# Patient Record
Sex: Female | Born: 1965 | Race: Black or African American | Hispanic: No | Smoking: Never smoker
Health system: Southern US, Academic
[De-identification: ages and names within clinical notes are randomized; demographics above are authoritative.]

## PROBLEM LIST (undated history)

## (undated) DIAGNOSIS — I10 Essential (primary) hypertension: Secondary | ICD-10-CM

## (undated) DIAGNOSIS — G43909 Migraine, unspecified, not intractable, without status migrainosus: Secondary | ICD-10-CM

## (undated) DIAGNOSIS — K219 Gastro-esophageal reflux disease without esophagitis: Secondary | ICD-10-CM

## (undated) DIAGNOSIS — F419 Anxiety disorder, unspecified: Secondary | ICD-10-CM

## (undated) DIAGNOSIS — F32A Depression, unspecified: Secondary | ICD-10-CM

## (undated) DIAGNOSIS — E119 Type 2 diabetes mellitus without complications: Secondary | ICD-10-CM

## (undated) HISTORY — PX: HX APPENDECTOMY: SHX54

## (undated) HISTORY — PX: HX CARPAL TUNNEL RELEASE: SHX101

## (undated) HISTORY — PX: HX HYSTERECTOMY: SHX81

## (undated) HISTORY — PX: HX TUBAL LIGATION: SHX77

## (undated) HISTORY — PX: HX TRIGGER FINGER RELEASE: 210000200

---

## 2003-07-13 ENCOUNTER — Emergency Department (HOSPITAL_COMMUNITY): Payer: Self-pay | Admitting: Emergency Medicine

## 2012-05-22 ENCOUNTER — Other Ambulatory Visit (HOSPITAL_COMMUNITY): Payer: Self-pay

## 2012-05-30 ENCOUNTER — Ambulatory Visit
Admission: RE | Admit: 2012-05-30 | Discharge: 2012-05-30 | Disposition: A | Discharge status: Home / Self Care | Payer: Self-pay | Attending: Diagnostic Radiology | Admitting: Diagnostic Radiology

## 2012-05-30 DIAGNOSIS — Z1231 Encounter for screening mammogram for malignant neoplasm of breast: Secondary | ICD-10-CM | POA: Insufficient documentation

## 2012-05-30 DIAGNOSIS — Z1239 Encounter for other screening for malignant neoplasm of breast: Secondary | ICD-10-CM

## 2012-06-03 ENCOUNTER — Other Ambulatory Visit (HOSPITAL_COMMUNITY): Payer: Self-pay | Admitting: Family

## 2012-06-03 DIAGNOSIS — Z1239 Encounter for other screening for malignant neoplasm of breast: Secondary | ICD-10-CM

## 2013-07-30 ENCOUNTER — Encounter (HOSPITAL_BASED_OUTPATIENT_CLINIC_OR_DEPARTMENT_OTHER): Payer: Self-pay

## 2019-04-17 IMAGING — MR MRI THORACIC SPINE WITHOUT CONTRAST
4 of 5 series · 26 of 48 positions shown · IV contrast (gadolinium)
Comparison: Radiographs dated 04/03/2019.

EXAM:  MRI THORACIC SPINE WITHOUT CONTRAST
INDICATION: Chronic mid back pain.
TECHNIQUE: Multiplanar multisequential MRI of the thoracic spine was performed without gadolinium contrast.

[Series 7: T2 · sagittal · 4.0mm · 0.78mm/px · 8 of 13 slices shown (1 of 2)]
[im 1/13]
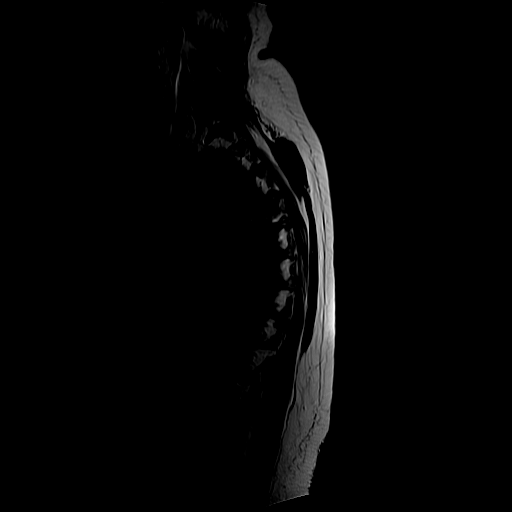
[im 2/13]
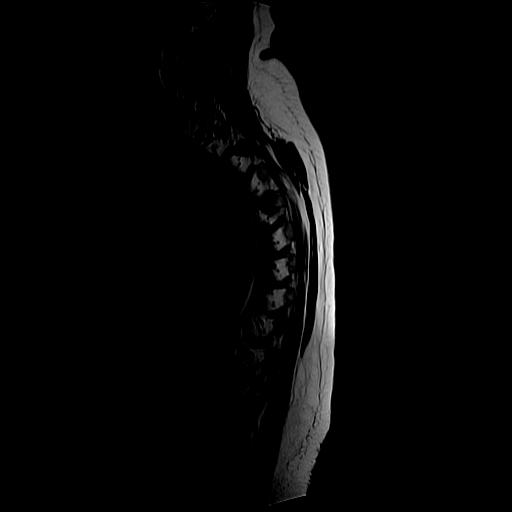
[im 5/13]
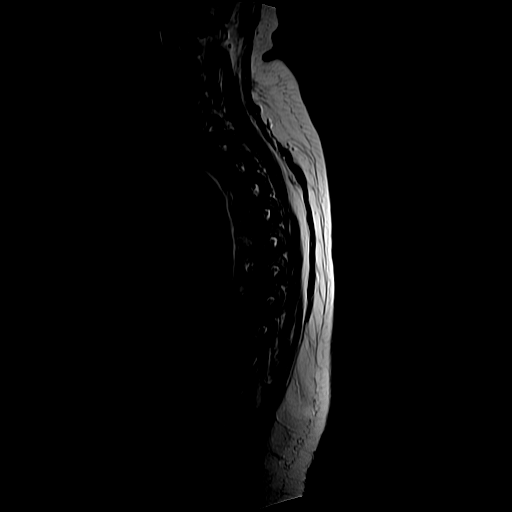
[im 6/13]
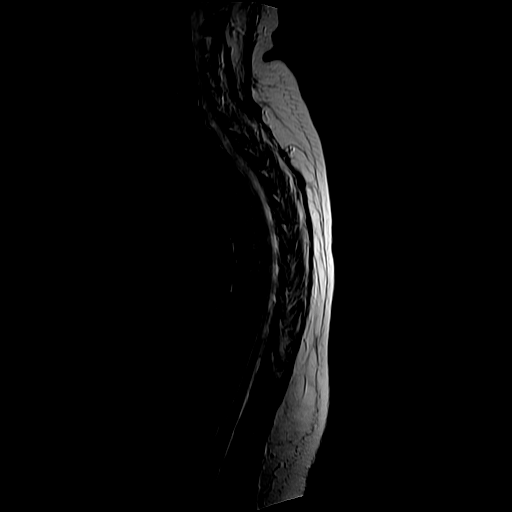
[im 7/13]
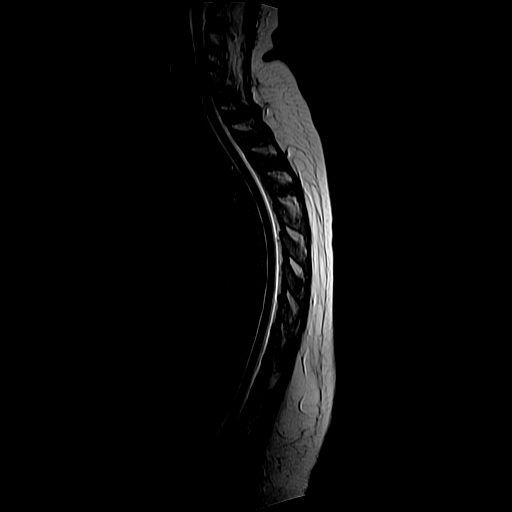
[im 9/13]
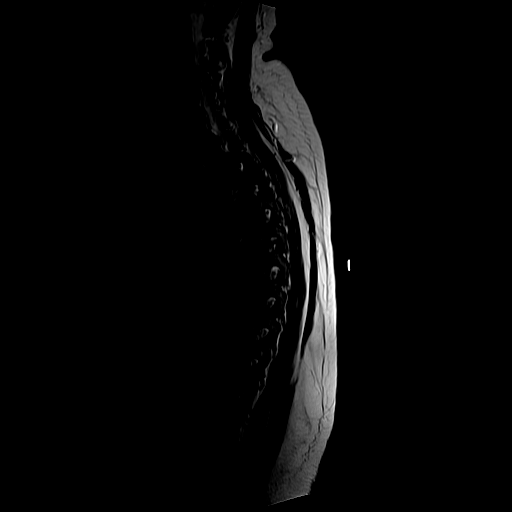
[im 11/13]
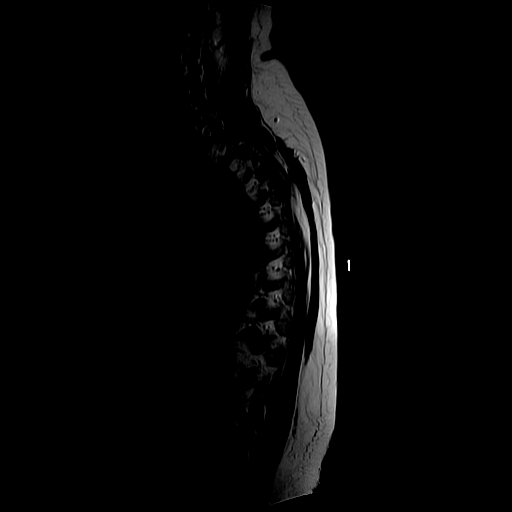
[im 13/13]
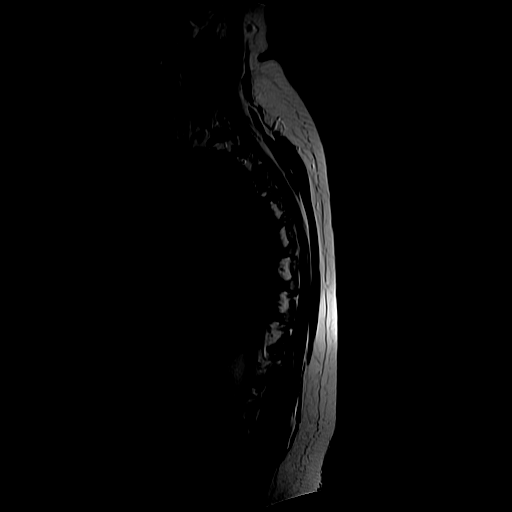

[Series 8: T1 · sagittal · 4.0mm · 0.78mm/px · 6 of 13 slices shown (1 of 2)]
[im 1/13]
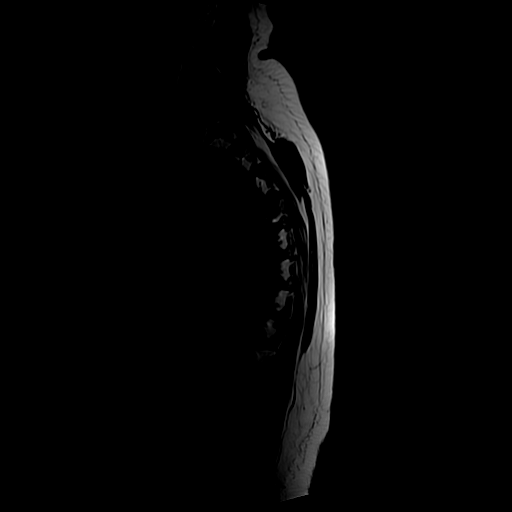
[im 2/13]
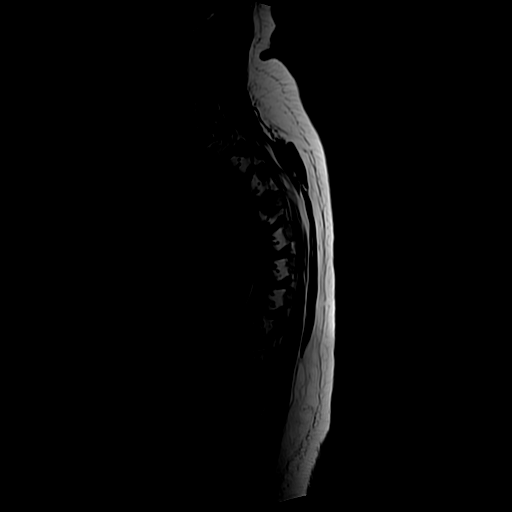
[im 5/13]
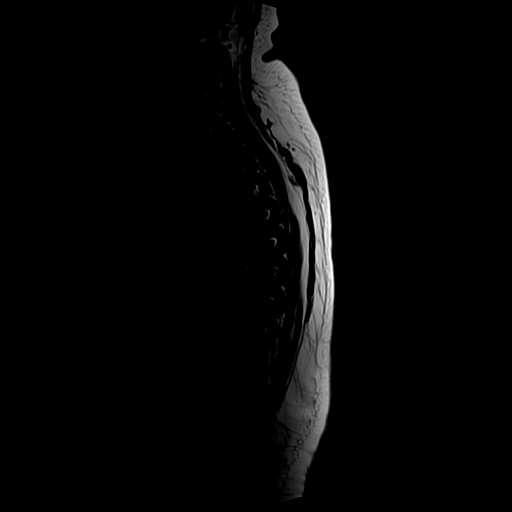
[im 6/13]
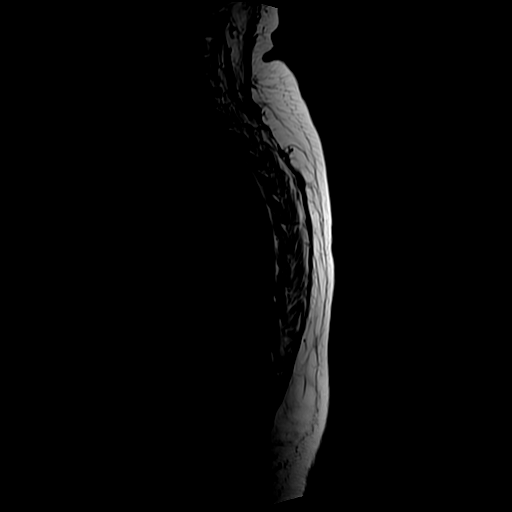
[im 7/13]
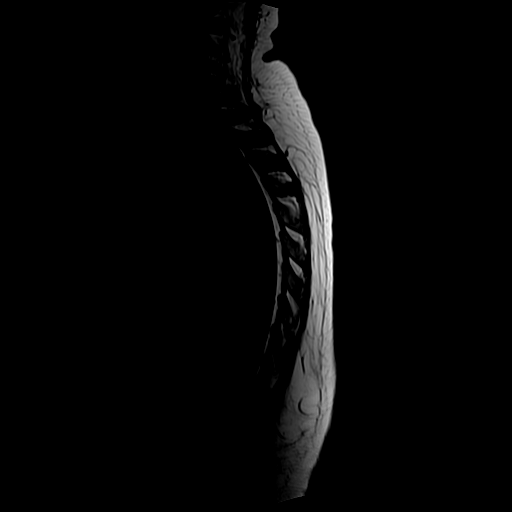
[im 11/13]
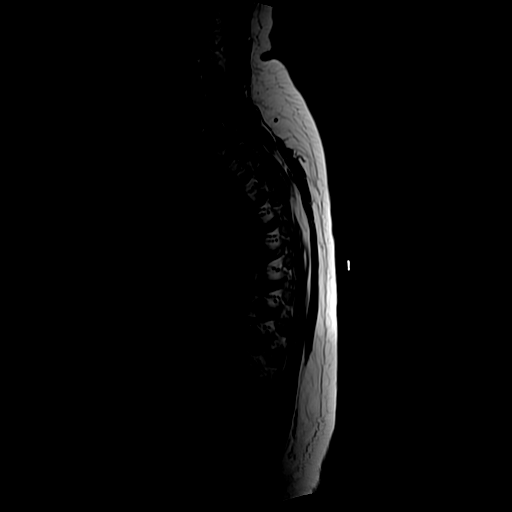

[Series 10: T2 · axial · 4.0mm · 0.62mm/px · z∈[-229,-71]mm · 9 of 12 slices shown (2 of 2)]
[im 1/12]
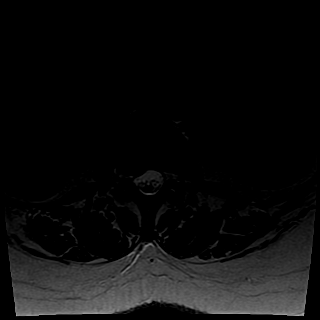
[im 2/12]
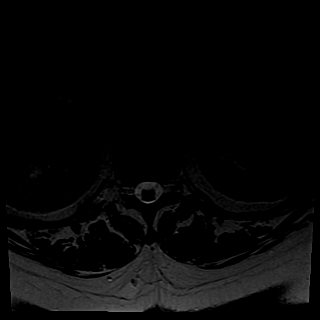
[im 3/12]
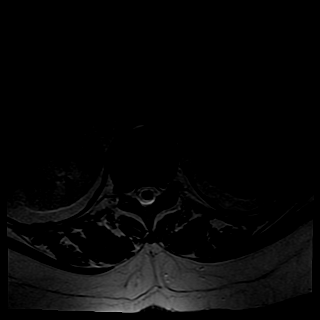
[im 5/12]
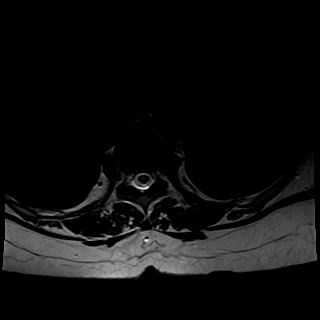
[im 6/12]
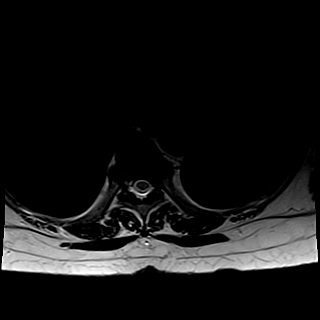
[im 7/12]
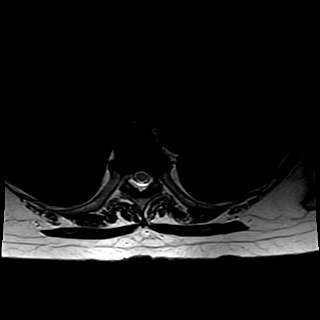
[im 9/12]
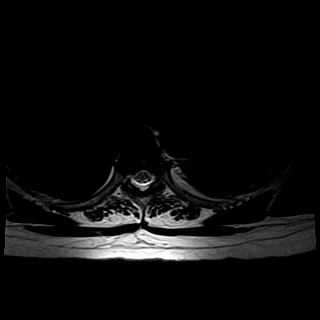
[im 10/12]
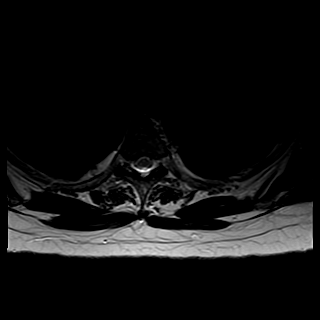
[im 12/12]
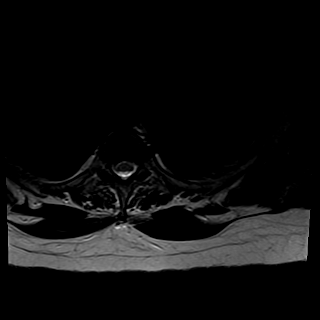

[Series 11: T1 · axial · 4.0mm · 0.62mm/px · z∈[-197,-78]mm · 3 of 12 slices shown (2 of 2)]
[im 2/12]
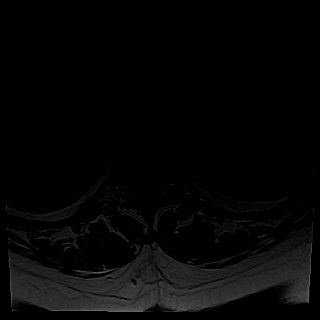
[im 6/12]
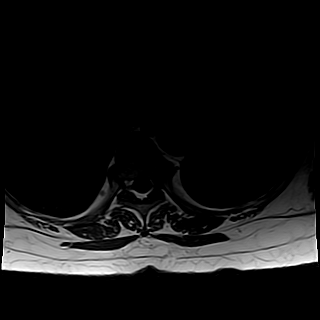
[im 10/12]
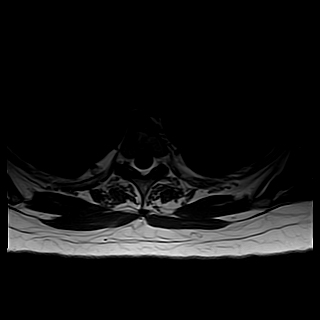

[26 of 48 positions shown; findings below may reference images not displayed]

FINDINGS: There is slight exaggeration of thoracic kyphosis. Vertebral bodies are normal in height and signal intensity. There is no acute fracture or subluxation. Visualized spinal cord is also normal in signal intensity without evidence of compression at any level.

There is no significant disc herniation, spinal canal or neural foraminal stenosis at any level. Paraspinal soft tissues are also unremarkable. There is no pleural effusion.
IMPRESSION: Unremarkable exam.

## 2020-02-20 IMAGING — MR MRI LUMBAR SPINE WITHOUT CONTRAST
9 series · 48 of 48 positions shown · IV contrast (gadolinium)
Comparison: Radiographs dated 04/03/2019.

EXAM:  MRI LUMBAR SPINE WITHOUT CONTRAST
INDICATION: Lower back pain with left lower extremity radiculopathy.
TECHNIQUE: Multiplanar multisequential MRI of the lumbar spine was performed without gadolinium contrast.

[Series 7: sca (id) · sagittal · 10.0mm · 1.76mm/px · 1 of 5 slices shown (1 of 3)]
[im 1/5]
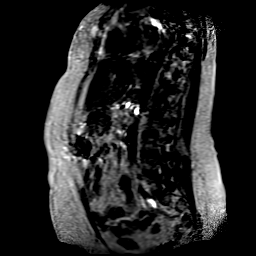

[Series 8: sca (id) · coronal · 10.0mm · 1.76mm/px · 2 of 5 slices shown (2 of 3)]
[im 1/5]
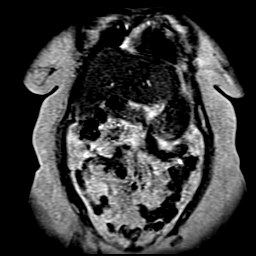
[im 5/5]
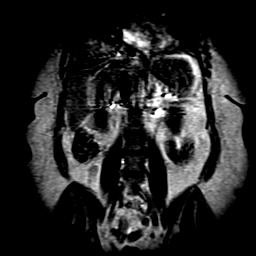

[Series 9: sca (id) · axial · 10.0mm · 1.76mm/px · z∈[-30,+30]mm · 2 of 5 slices shown (3 of 3)]
[im 1/5]
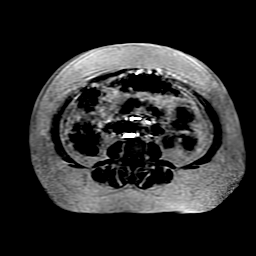
[im 5/5]
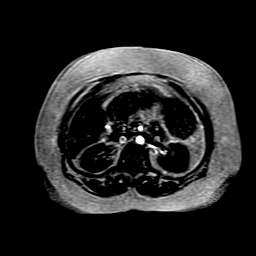

[Series 12: T2 · coronal · 4.0mm · 1.34mm/px · 8 of 20 slices shown (1 of 3)]
[im 1/20]
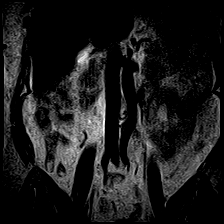
[im 3/20]
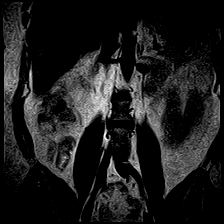
[im 6/20]
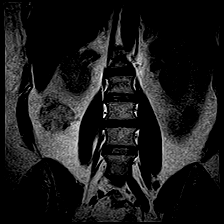
[im 9/20]
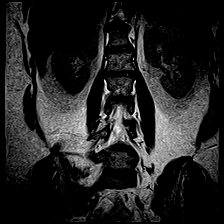
[im 11/20]
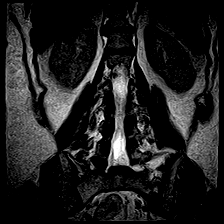
[im 14/20]
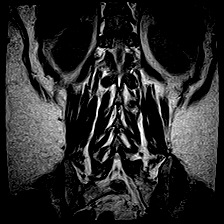
[im 17/20]
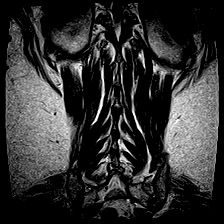
[im 20/20]
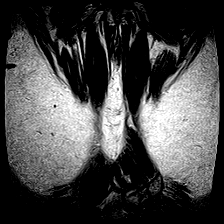

[Series 13: T2 · sagittal · 5.0mm · 1.00mm/px · 5 of 13 slices shown (2 of 3)]
[im 1/13]
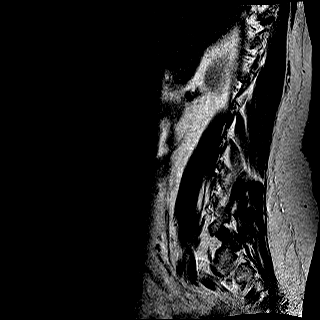
[im 4/13]
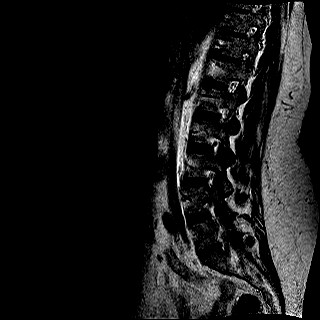
[im 7/13]
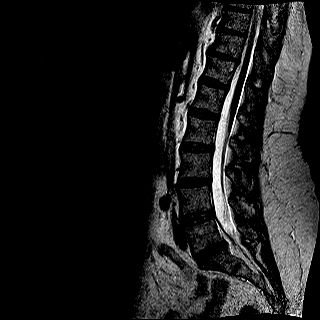
[im 10/13]
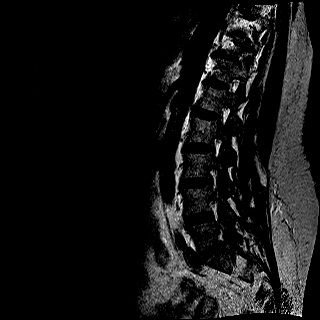
[im 13/13]
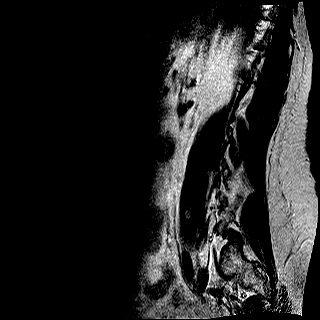

[Series 14: T1 · sagittal · 5.0mm · 1.00mm/px · 5 of 13 slices shown (1 of 2)]
[im 1/13]
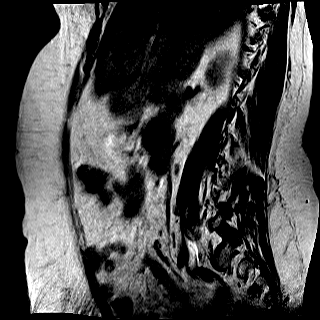
[im 4/13]
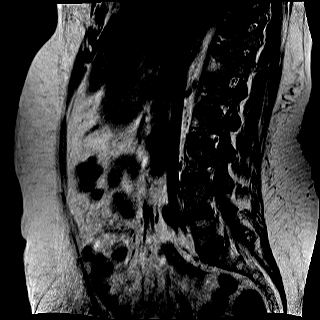
[im 7/13]
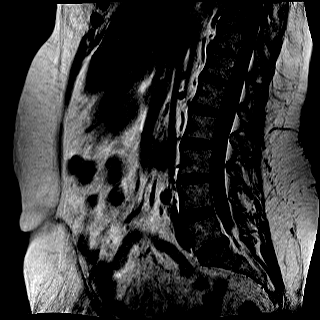
[im 10/13]
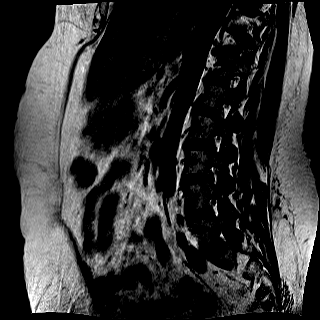
[im 13/13]
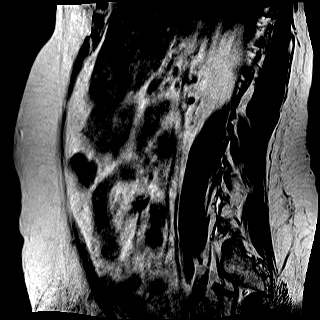

[Series 15: STIR · sagittal · 5.0mm · 1.25mm/px · 5 of 13 slices shown]
[im 1/13]
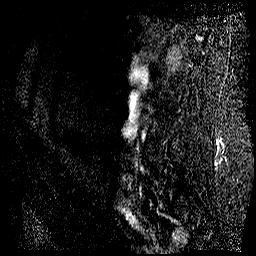
[im 4/13]
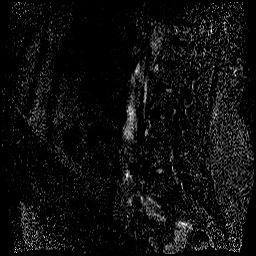
[im 7/13]
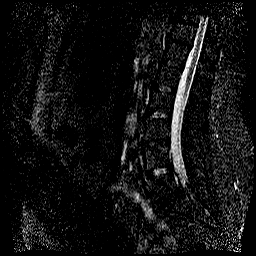
[im 10/13]
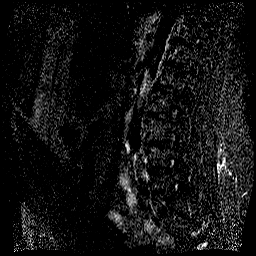
[im 13/13]
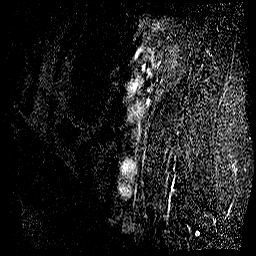

[Series 16: T2 · axial · 5.0mm · 0.89mm/px · z∈[-158,+51]mm · 10 of 25 slices shown (3 of 3)]
[im 1/25]
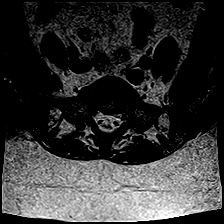
[im 3/25]
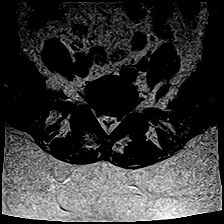
[im 6/25]
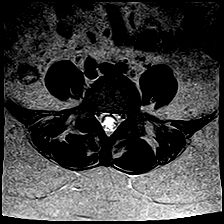
[im 9/25]
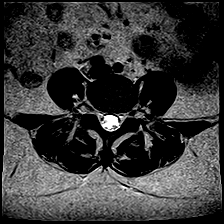
[im 11/25]
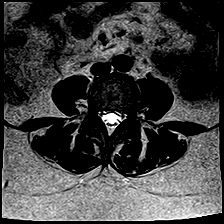
[im 14/25]
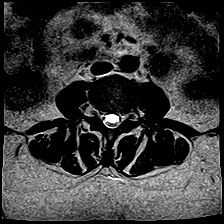
[im 17/25]
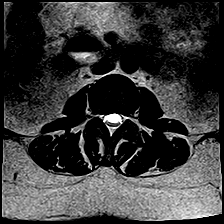
[im 19/25]
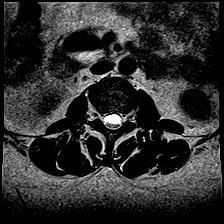
[im 22/25]
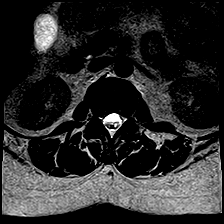
[im 25/25]
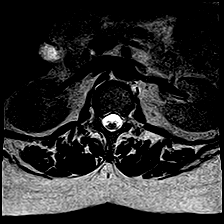

[Series 19: T1 · axial · 5.0mm · 0.89mm/px · z∈[-158,+51]mm · 10 of 25 slices shown (2 of 2)]
[im 1/25]
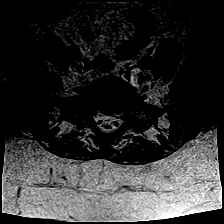
[im 3/25]
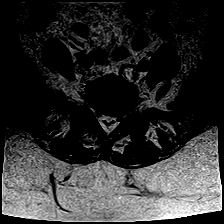
[im 6/25]
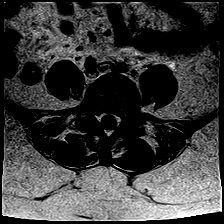
[im 9/25]
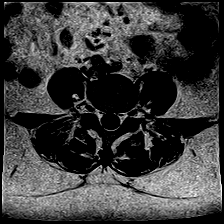
[im 11/25]
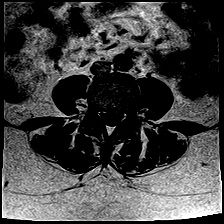
[im 14/25]
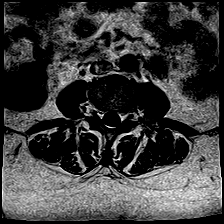
[im 17/25]
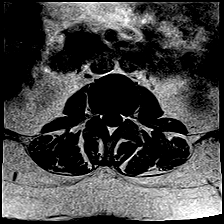
[im 19/25]
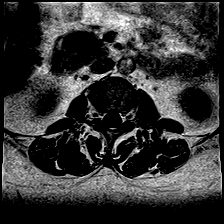
[im 22/25]
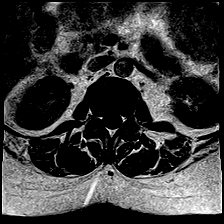
[im 25/25]
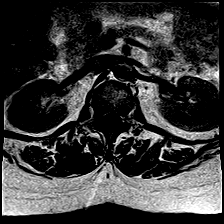

[48 of 48 positions shown; findings below may reference images not displayed]

FINDINGS: Vertebral bodies are normal in height, alignment and signal intensity. There is no acute fracture or subluxation. Distal spinal cord is normal in signal intensity and terminates normally at L1-2 disc space level. Spinal canal is congenitally narrow. There is a clumping of nerve roots within the dependent thecal sac.

L1-2 and L2-3 levels are unremarkable.

At L3-4 level, there is mild-to-moderate bilateral neural foraminal stenosis from facet arthropathy and bulging annulus.

At L4-5 level, there is mild bilateral neural foraminal stenosis from facet arthropathy without nerve root impingement.

At L5-S1 level, there is a small broad-based central disc bulge without mass effect on the thecal sac. There is severe spinal stenosis from epidural lipomatosis. There is also moderate bilateral neural foraminal stenosis from facet arthropathy.

Paraspinal soft tissues are unremarkable.
IMPRESSION: Clumping of nerve roots within the dependent thecal sac, suggestive of arachnoiditis. 

No significant disc herniation at any level. 

Severe spinal stenosis at L5-S1 level from epidural lipomatosis. 

Multilevel neural foraminal stenosis as detailed above.

## 2020-03-16 IMAGING — MR MRI LUMBAR SPINE WITHOUT AND WITH CONTRAST
4 series · 48 of 48 positions shown · IV contrast (Gadavist)
Comparison: Noncontrast MRI dated 02/20/2020.

﻿EXAM:  MRI LUMBAR SPINE WITHOUT AND WITH CONTRAST
INDICATION: Arachnoiditis, back pain.
TECHNIQUE: Axial and sagittal T1 fat saturated sequences were obtained without and with 4 mL of Gadavist.

[Series 11: T1 fat-sat · sagittal · 4.0mm · 1.05mm/px · 9 of 13 slices shown (1 of 2)]
[im 1/13]
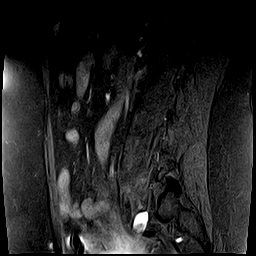
[im 2/13]
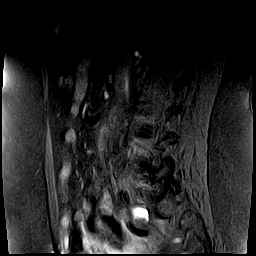
[im 4/13]
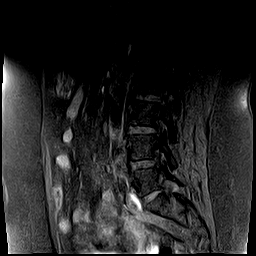
[im 5/13]
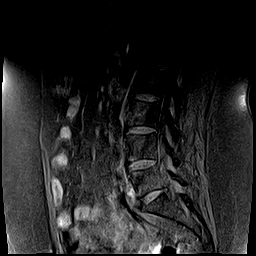
[im 7/13]
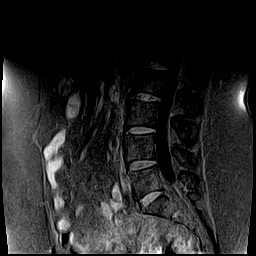
[im 8/13]
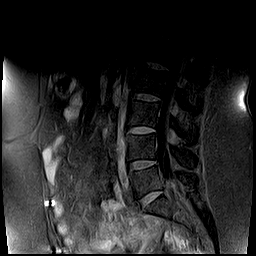
[im 10/13]
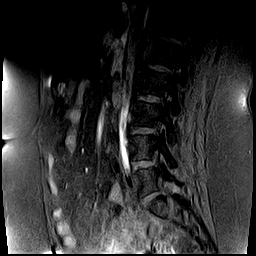
[im 11/13]
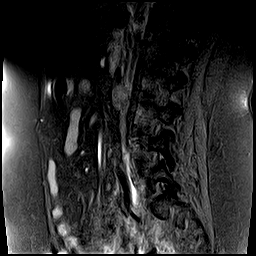
[im 13/13]
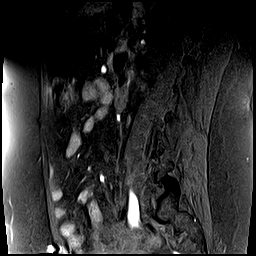

[Series 12: T1 fat-sat · axial · 4.0mm · 0.70mm/px · z∈[-105,+94]mm · 15 of 23 slices shown (2 of 2)]
[im 1/23]
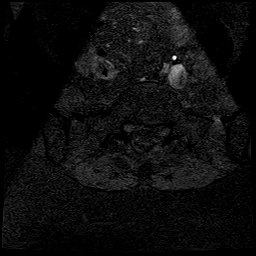
[im 2/23]
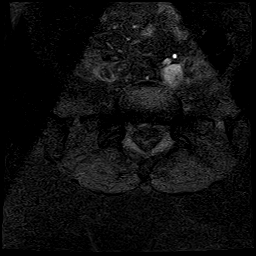
[im 4/23]
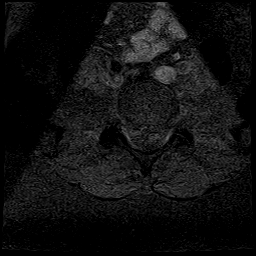
[im 5/23]
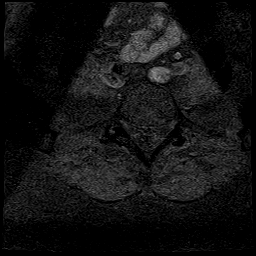
[im 7/23]
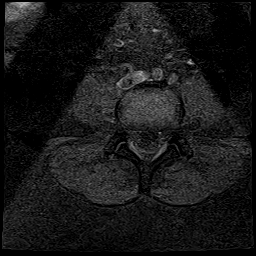
[im 8/23]
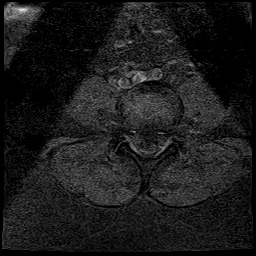
[im 10/23]
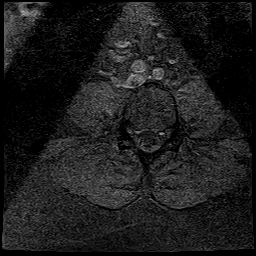
[im 12/23]
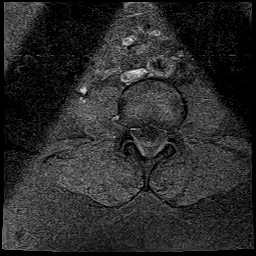
[im 13/23]
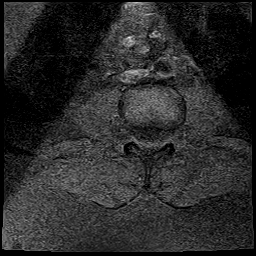
[im 15/23]
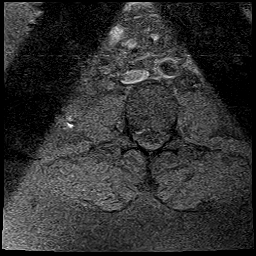
[im 16/23]
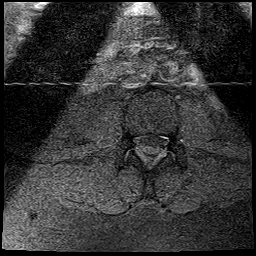
[im 18/23]
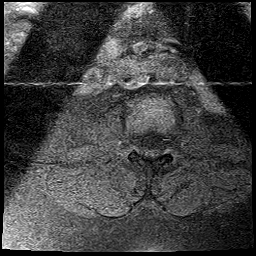
[im 19/23]
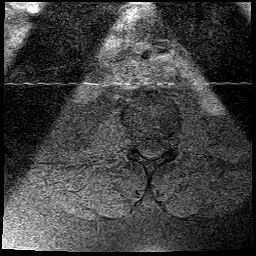
[im 21/23]
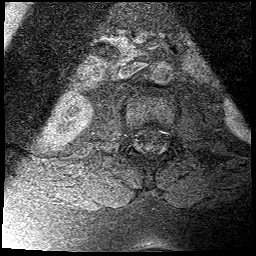
[im 23/23]
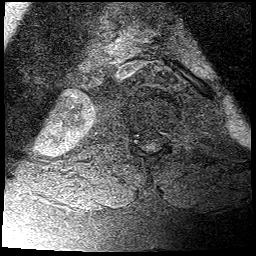

[Series 13: T1 fat-sat post-contrast · sagittal · 4.0mm · 1.05mm/px · 9 of 13 slices shown (1 of 2)]
[im 1/13]
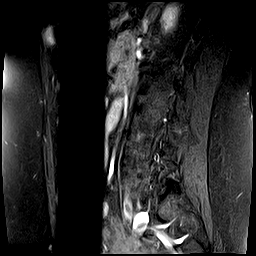
[im 2/13]
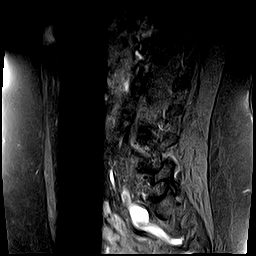
[im 4/13]
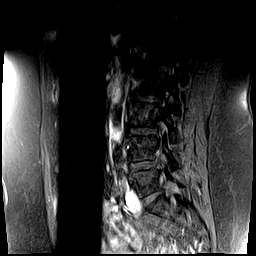
[im 5/13]
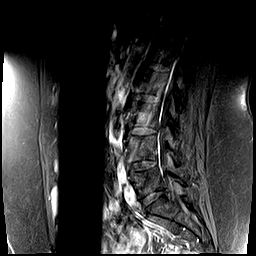
[im 7/13]
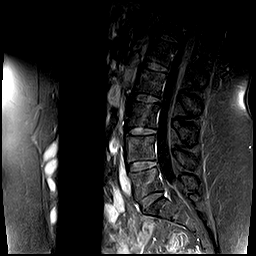
[im 8/13]
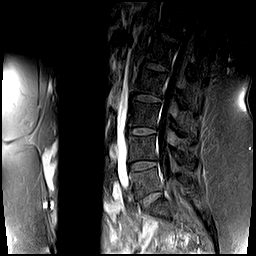
[im 10/13]
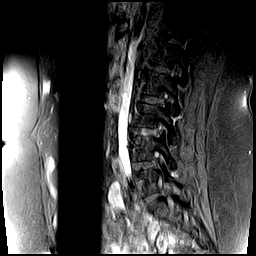
[im 11/13]
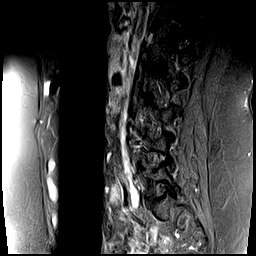
[im 13/13]
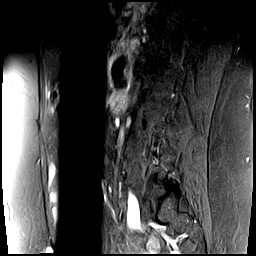

[Series 14: T1 fat-sat post-contrast · axial · 4.0mm · 0.70mm/px · z∈[-105,+94]mm · 15 of 23 slices shown (2 of 2)]
[im 1/23]
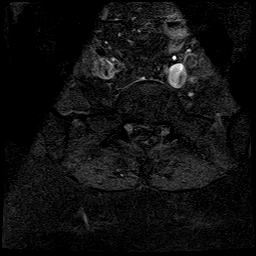
[im 2/23]
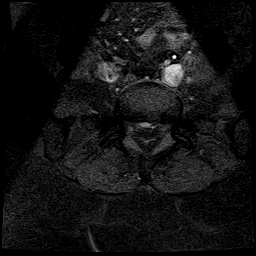
[im 4/23]
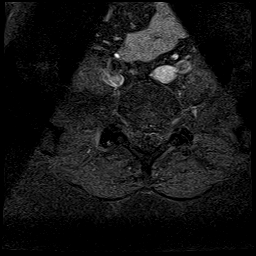
[im 5/23]
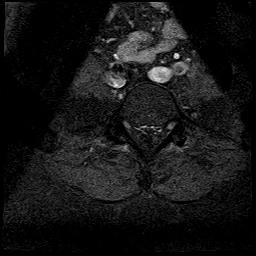
[im 7/23]
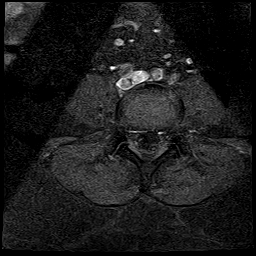
[im 8/23]
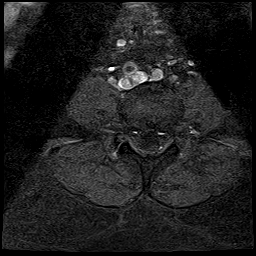
[im 10/23]
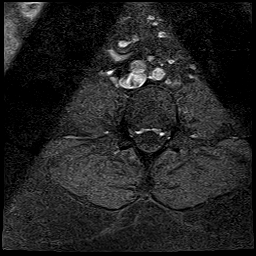
[im 12/23]
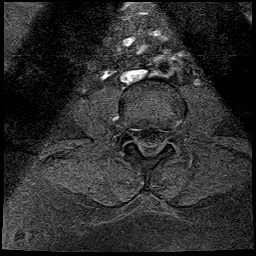
[im 13/23]
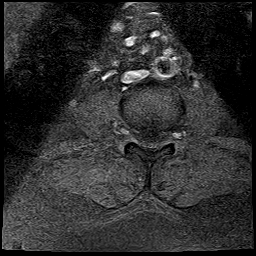
[im 15/23]
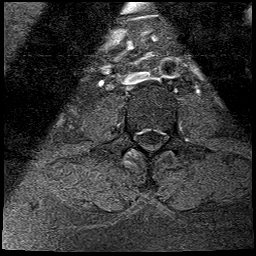
[im 16/23]
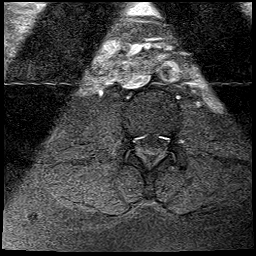
[im 18/23]
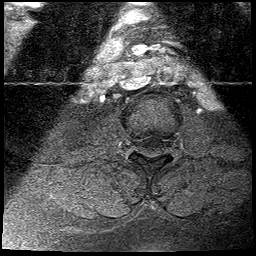
[im 19/23]
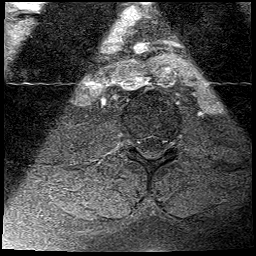
[im 21/23]
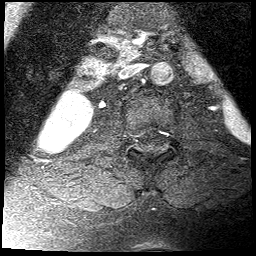
[im 23/23]
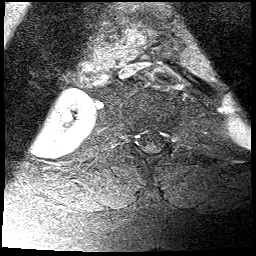

[48 of 48 positions shown; findings below may reference images not displayed]

FINDINGS: No abnormal enhancement of the nerve roots or paraspinal soft tissues is identified. Clumping of the nerve roots is again noted within the dependent thecal sac, most compatible with arachnoiditis.
IMPRESSION: As above.

## 2020-07-06 IMAGING — US US ABDOMEN COMPLETE
1 series · 14 of 25 positions shown · non-contrast
Comparison: None available.

﻿EXAM:  US ABDOMEN COMPLETE
INDICATION: Constipation.

[Series 1: us abdomen complete · 14 of 53 slices shown]
[im 1/53]
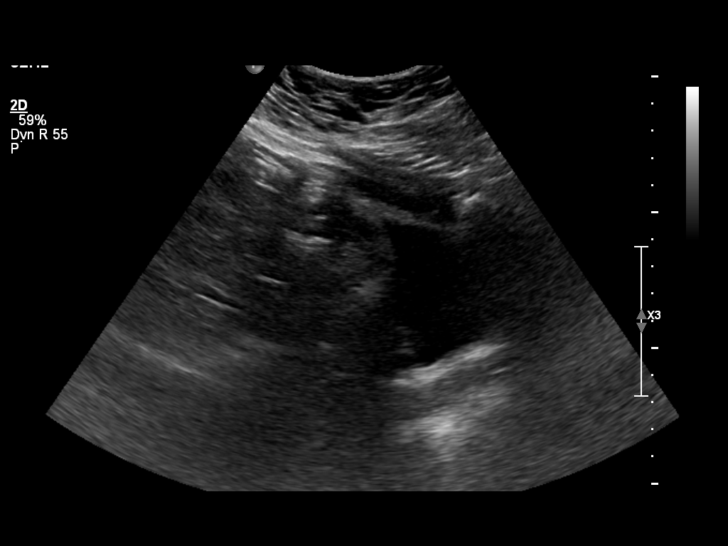
[im 5/53]
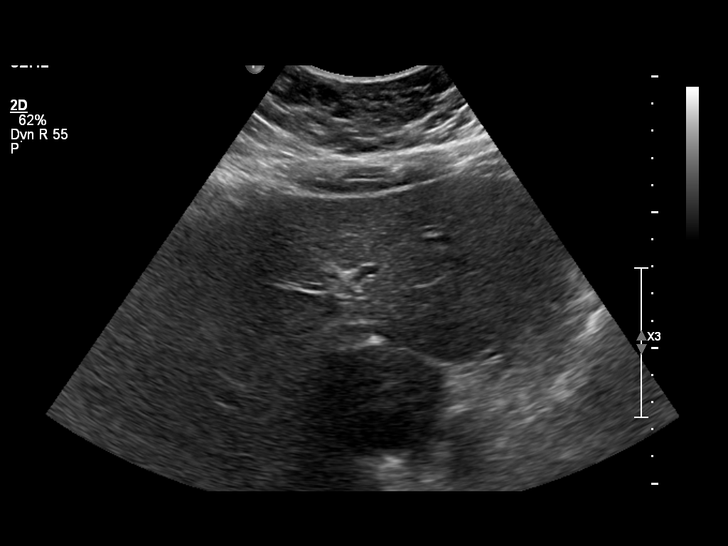
[im 9/53]
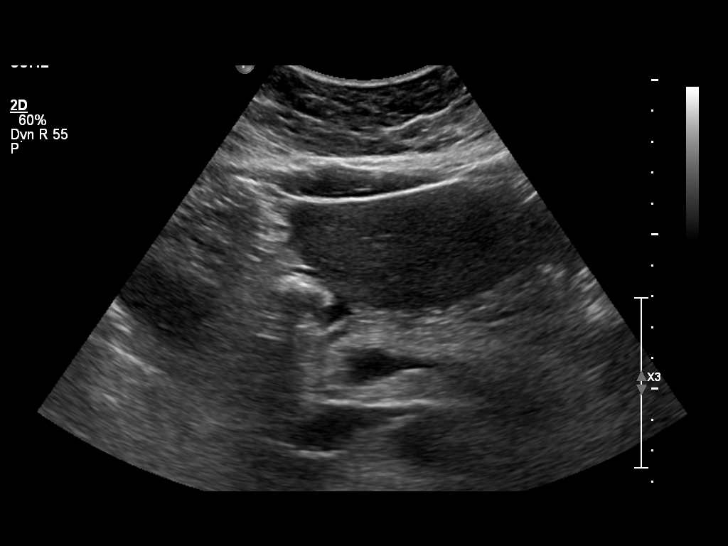
[im 14/53]
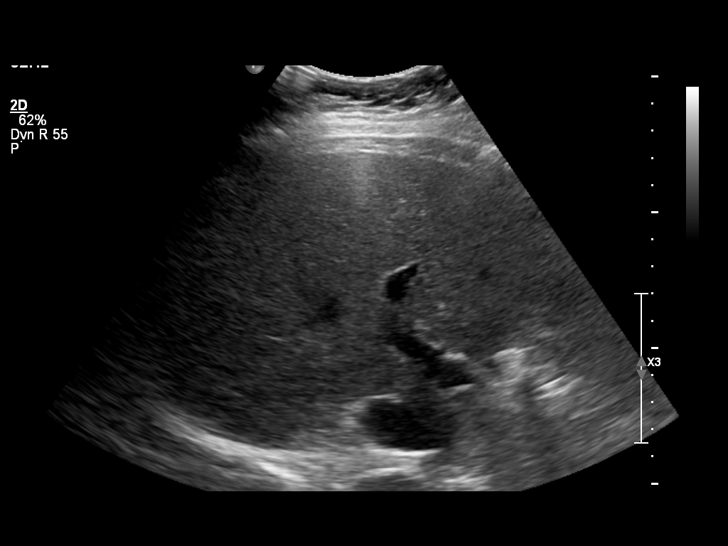
[im 18/53]
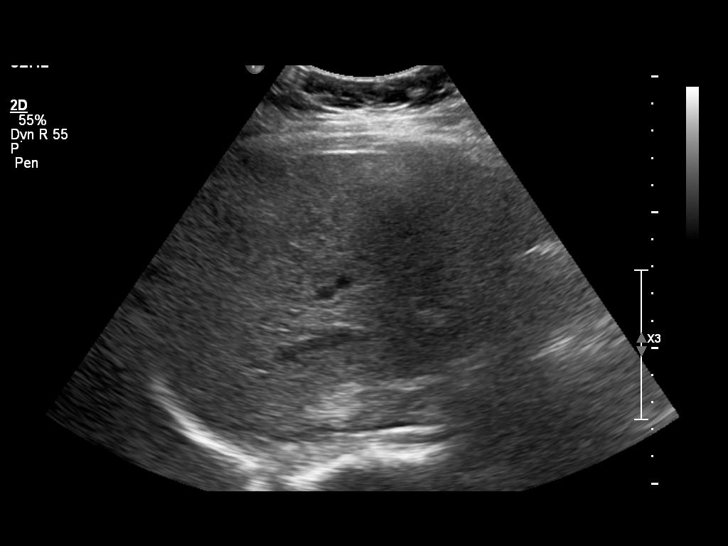
[im 20/53]
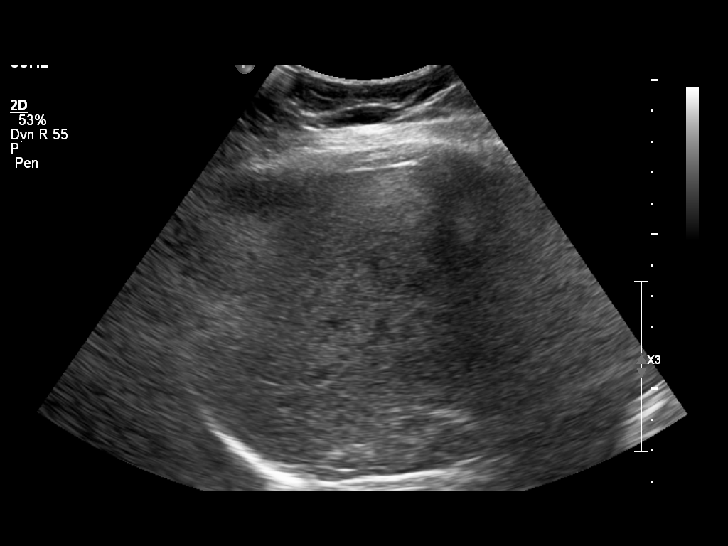
[im 24/53]
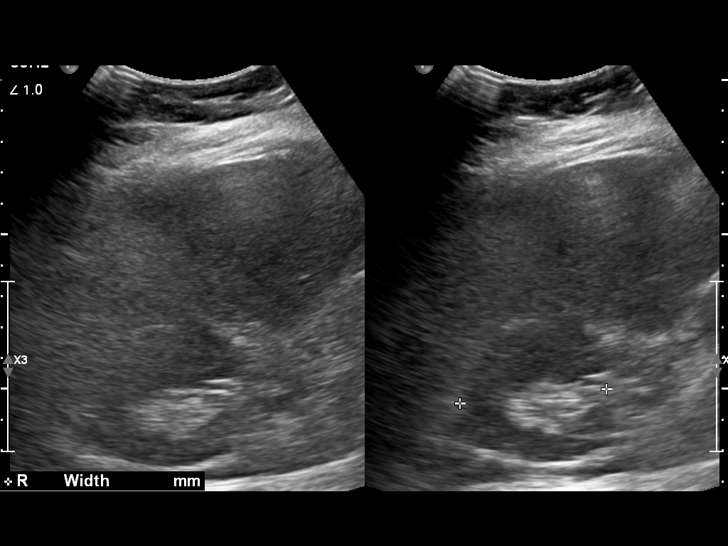
[im 29/53]
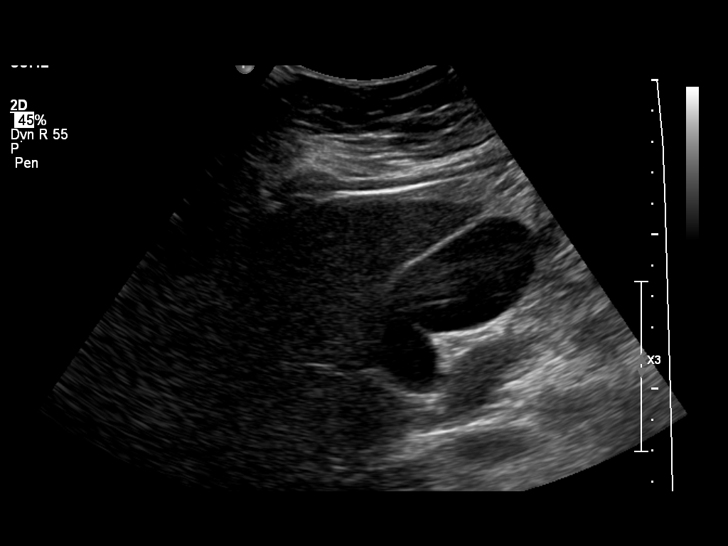
[im 33/53]
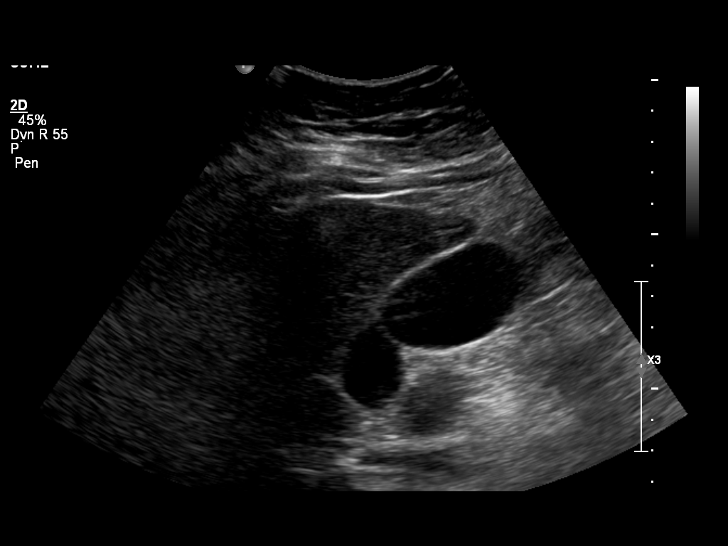
[im 35/53]
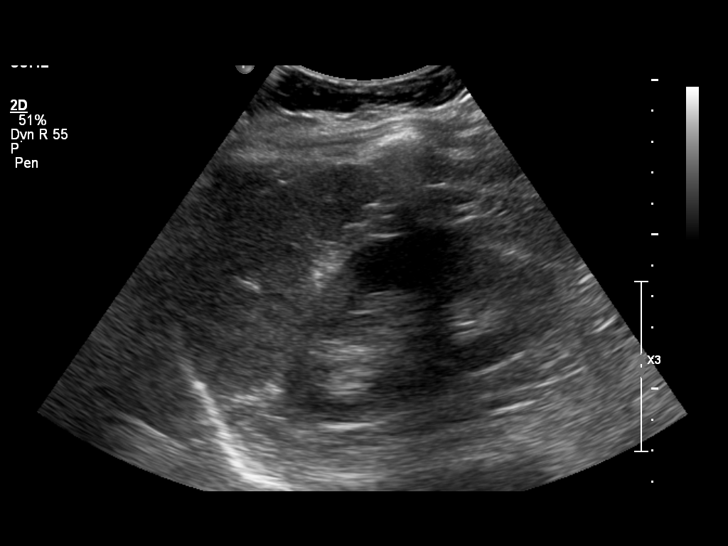
[im 40/53]
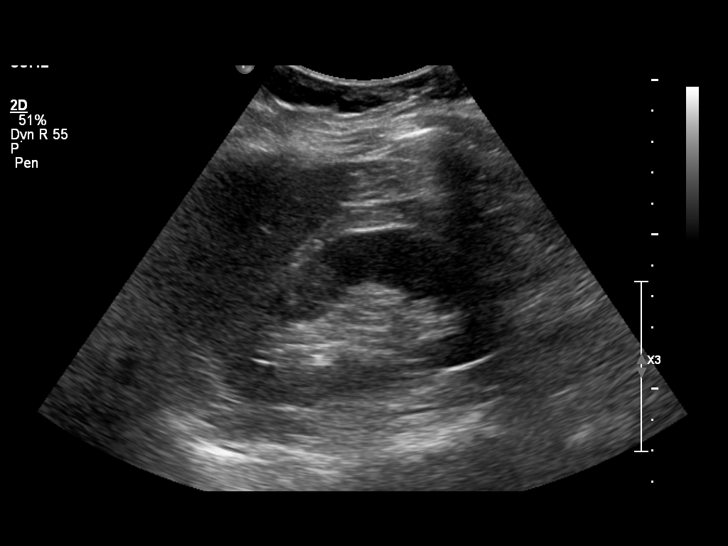
[im 44/53]
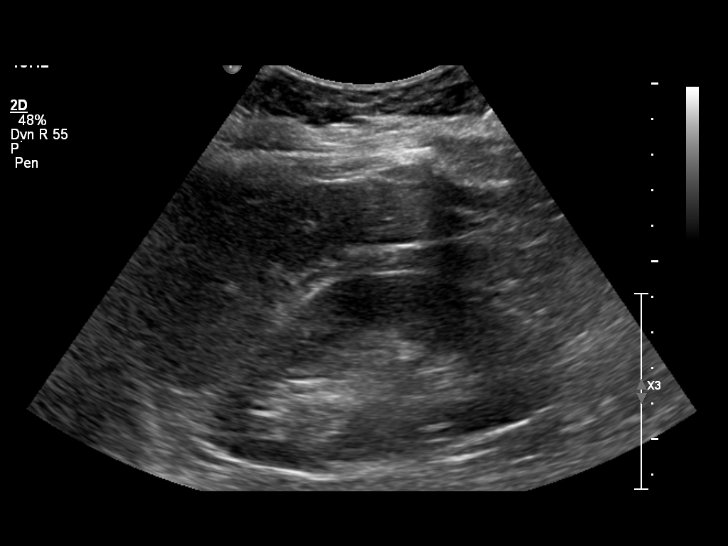
[im 48/53]
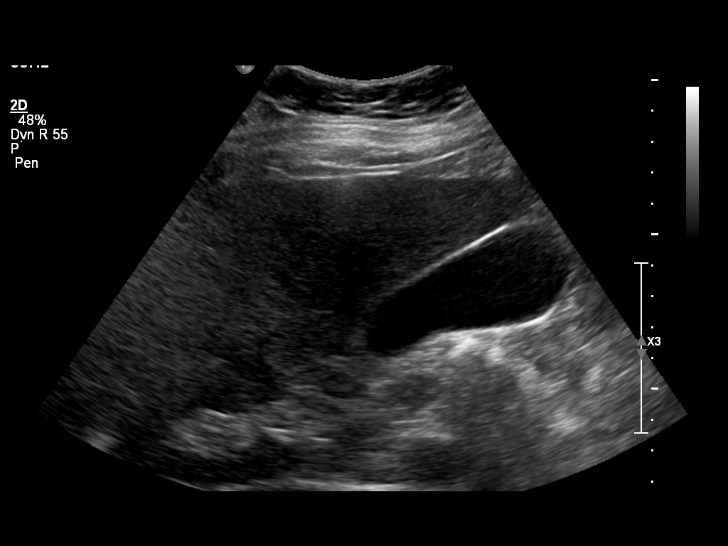
[im 53/53]
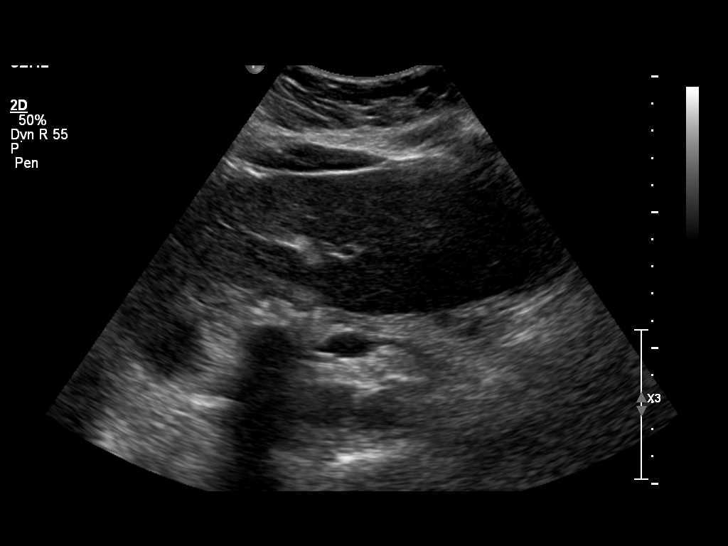

[14 of 25 positions shown; findings below may reference images not displayed]

FINDINGS: Liver is normal in echogenicity. There is no hepatic mass. There is no intra or extrahepatic biliary ductal dilatation. Common bile duct measures 4 mm. No sludge or shadowing gallstones are seen. There is no gallbladder wall thickening or pericholecystic fluid. Pancreas is incompletely visualized due to artifact from overlying bowel gas. Spleen measures 9 cm and is unremarkable.

 Kidneys are normal in echogenicity and measure 10 cm bilaterally. There is no hydronephrosis, mass or cyst on either side.

Visualized abdominal aorta is without aneurysmal dilatation. IVC is normal. There is no ascites.
IMPRESSION: 1. Unremarkable liver. 

2. No evidence of cholelithiasis or acute cholecystitis. 

3. Pancreas incompletely visualized due to artifact from overlying bowel gas.

## 2022-01-16 ENCOUNTER — Other Ambulatory Visit (HOSPITAL_COMMUNITY): Payer: Self-pay | Admitting: Orthopaedic Surgery

## 2022-01-16 DIAGNOSIS — M1712 Unilateral primary osteoarthritis, left knee: Secondary | ICD-10-CM

## 2022-01-16 DIAGNOSIS — M1711 Unilateral primary osteoarthritis, right knee: Secondary | ICD-10-CM

## 2022-01-26 ENCOUNTER — Other Ambulatory Visit: Payer: Self-pay

## 2022-01-26 ENCOUNTER — Inpatient Hospital Stay
Admission: RE | Admit: 2022-01-26 | Discharge: 2022-01-26 | Disposition: A | Discharge status: Home / Self Care | Payer: MEDICAID | Source: Ambulatory Visit | Attending: Orthopaedic Surgery | Admitting: Orthopaedic Surgery

## 2022-01-26 ENCOUNTER — Inpatient Hospital Stay (HOSPITAL_COMMUNITY)
Admission: RE | Admit: 2022-01-26 | Discharge: 2022-01-26 | Disposition: A | Discharge status: Home / Self Care | Payer: MEDICAID | Source: Ambulatory Visit

## 2022-01-26 DIAGNOSIS — M1711 Unilateral primary osteoarthritis, right knee: Secondary | ICD-10-CM | POA: Insufficient documentation

## 2022-01-26 DIAGNOSIS — M1712 Unilateral primary osteoarthritis, left knee: Secondary | ICD-10-CM | POA: Insufficient documentation

## 2022-02-21 ENCOUNTER — Other Ambulatory Visit (HOSPITAL_COMMUNITY): Payer: MEDICAID

## 2022-02-21 ENCOUNTER — Ambulatory Visit (HOSPITAL_COMMUNITY): Payer: Self-pay

## 2022-02-24 ENCOUNTER — Other Ambulatory Visit: Payer: MEDICAID | Attending: Orthopaedic Surgery

## 2022-02-24 ENCOUNTER — Other Ambulatory Visit: Payer: Self-pay

## 2022-02-24 ENCOUNTER — Inpatient Hospital Stay (HOSPITAL_COMMUNITY)
Admission: RE | Admit: 2022-02-24 | Discharge: 2022-02-24 | Disposition: A | Discharge status: Home / Self Care | Payer: MEDICAID | Source: Ambulatory Visit

## 2022-02-24 ENCOUNTER — Other Ambulatory Visit (HOSPITAL_COMMUNITY): Payer: Self-pay | Admitting: Orthopaedic Surgery

## 2022-02-24 DIAGNOSIS — Z01818 Encounter for other preprocedural examination: Secondary | ICD-10-CM

## 2022-02-24 LAB — CBC WITH DIFF
BASOPHIL #: 0.1 10*3/uL (ref 0.00–0.30)
BASOPHIL %: 1 % (ref 0–3)
EOSINOPHIL #: 0.1 10*3/uL (ref 0.00–0.80)
EOSINOPHIL %: 2 % (ref 0–7)
HCT: 39.5 % (ref 37.0–47.0)
HGB: 12.7 g/dL (ref 12.5–16.0)
LYMPHOCYTE #: 3.3 10*3/uL (ref 1.10–5.00)
LYMPHOCYTE %: 56 % — ABNORMAL HIGH (ref 25–45)
MCH: 27.8 pg (ref 27.0–32.0)
MCHC: 32.1 g/dL (ref 32.0–36.0)
MCV: 86.4 fL (ref 78.0–99.0)
MONOCYTE #: 0.3 10*3/uL (ref 0.00–1.30)
MONOCYTE %: 5 % (ref 0–12)
MPV: 8 fL (ref 7.4–10.4)
NEUTROPHIL #: 2.2 10*3/uL (ref 1.80–8.40)
NEUTROPHIL %: 36 % — ABNORMAL LOW (ref 40–76)
PLATELETS: 307 10*3/uL (ref 140–440)
RBC: 4.57 10*6/uL (ref 4.20–5.40)
RDW: 13.8 % (ref 11.6–14.8)
WBC: 6 10*3/uL (ref 4.0–10.5)
WBCS UNCORRECTED: 6 10*3/uL

## 2022-02-24 LAB — BASIC METABOLIC PANEL
ANION GAP: 6 mmol/L — ABNORMAL LOW (ref 10–20)
BUN/CREA RATIO: 21 (ref 6–22)
BUN: 16 mg/dL (ref 7–25)
CALCIUM: 10 mg/dL (ref 8.6–10.3)
CHLORIDE: 106 mmol/L (ref 98–107)
CO2 TOTAL: 29 mmol/L (ref 21–31)
CREATININE: 0.75 mg/dL (ref 0.60–1.30)
ESTIMATED GFR: 93 mL/min/{1.73_m2} (ref >59)
GLUCOSE: 104 mg/dL (ref 74–109)
OSMOLALITY, CALCULATED: 283 mOsm/kg (ref 270–290)
POTASSIUM: 3.9 mmol/L (ref 3.5–5.1)
SODIUM: 141 mmol/L (ref 136–145)

## 2022-02-24 LAB — URINALYSIS, MACROSCOPIC
BILIRUBIN: NEGATIVE mg/dL
BLOOD: NEGATIVE mg/dL
GLUCOSE: NEGATIVE mg/dL
KETONES: NEGATIVE mg/dL
LEUKOCYTES: NEGATIVE WBCs/uL
NITRITE: NEGATIVE
PH: 7 (ref 5.0–9.0)
PROTEIN: 20 mg/dL
SPECIFIC GRAVITY: 1.03 (ref 1.002–1.030)
UROBILINOGEN: NORMAL mg/dL

## 2022-02-24 LAB — URINALYSIS, MICROSCOPIC
RBCS: 5 /hpf — ABNORMAL HIGH (ref <4)
SQUAMOUS EPITHELIAL: 4 /hpf (ref <28)
WBCS: 1 /hpf (ref <6)

## 2022-02-25 LAB — HGA1C (HEMOGLOBIN A1C WITH EST AVG GLUCOSE): HEMOGLOBIN A1C: 6.7 % — ABNORMAL HIGH (ref 4.0–6.0)

## 2022-02-26 LAB — ECG 12 LEAD
Atrial Rate: 73 {beats}/min
Calculated P Axis: 70 degrees
Calculated R Axis: 6 degrees
Calculated T Axis: 24 degrees
PR Interval: 158 ms
QRS Duration: 84 ms
QT Interval: 414 ms
QTC Calculation: 456 ms
Ventricular rate: 73 {beats}/min

## 2022-03-14 ENCOUNTER — Ambulatory Visit (HOSPITAL_COMMUNITY): Payer: MEDICAID | Admitting: Student in an Organized Health Care Education/Training Program

## 2022-03-14 ENCOUNTER — Encounter (HOSPITAL_COMMUNITY)
Admission: RE | Disposition: A | Discharge status: Home / Self Care | Payer: Self-pay | Source: Ambulatory Visit | Attending: Orthopaedic Surgery

## 2022-03-14 ENCOUNTER — Other Ambulatory Visit: Payer: Self-pay

## 2022-03-14 ENCOUNTER — Ambulatory Visit
Admission: RE | Admit: 2022-03-14 | Discharge: 2022-03-15 | Disposition: A | Discharge status: Home / Self Care | Payer: MEDICAID | Source: Ambulatory Visit | Attending: Orthopaedic Surgery | Admitting: Orthopaedic Surgery

## 2022-03-14 ENCOUNTER — Ambulatory Visit (HOSPITAL_COMMUNITY): Payer: MEDICAID | Admitting: Orthopaedic Surgery

## 2022-03-14 ENCOUNTER — Encounter (HOSPITAL_COMMUNITY): Payer: Self-pay | Admitting: Orthopaedic Surgery

## 2022-03-14 DIAGNOSIS — Z96653 Presence of artificial knee joint, bilateral: Secondary | ICD-10-CM

## 2022-03-14 DIAGNOSIS — Z7984 Long term (current) use of oral hypoglycemic drugs: Secondary | ICD-10-CM | POA: Insufficient documentation

## 2022-03-14 DIAGNOSIS — M170 Bilateral primary osteoarthritis of knee: Secondary | ICD-10-CM | POA: Insufficient documentation

## 2022-03-14 DIAGNOSIS — Z96651 Presence of right artificial knee joint: Secondary | ICD-10-CM | POA: Diagnosis present

## 2022-03-14 DIAGNOSIS — Z79899 Other long term (current) drug therapy: Secondary | ICD-10-CM | POA: Insufficient documentation

## 2022-03-14 DIAGNOSIS — I10 Essential (primary) hypertension: Secondary | ICD-10-CM | POA: Insufficient documentation

## 2022-03-14 DIAGNOSIS — E119 Type 2 diabetes mellitus without complications: Secondary | ICD-10-CM | POA: Insufficient documentation

## 2022-03-14 HISTORY — DX: Gastro-esophageal reflux disease without esophagitis: K21.9

## 2022-03-14 HISTORY — DX: Type 2 diabetes mellitus without complications (CMS HCC): E11.9

## 2022-03-14 HISTORY — DX: Depression, unspecified: F32.A

## 2022-03-14 HISTORY — DX: Anxiety disorder, unspecified: F41.9

## 2022-03-14 HISTORY — DX: Essential (primary) hypertension: I10

## 2022-03-14 HISTORY — DX: Migraine, unspecified, not intractable, without status migrainosus: G43.909

## 2022-03-14 LAB — POC BLOOD GLUCOSE (RESULTS)
GLUCOSE, POC: 117 mg/dl (ref 50–500)
GLUCOSE, POC: 163 mg/dl (ref 50–500)
GLUCOSE, POC: 177 mg/dl (ref 50–500)
GLUCOSE, POC: 194 mg/dl (ref 50–500)

## 2022-03-14 SURGERY — ARTHROPLASTY KNEE TOTAL
Anesthesia: General | Site: Knee | Laterality: Bilateral | Wound class: Clean Wound: Uninfected operative wounds in which no inflammation occurred

## 2022-03-14 MED ORDER — HYDROCHLOROTHIAZIDE 25 MG TABLET
25.0000 mg | ORAL_TABLET | Freq: Every day | ORAL | Status: DC
Start: 2022-03-14 — End: 2022-03-15
  Administered 2022-03-14 – 2022-03-15 (×2): 25 mg via ORAL
  Filled 2022-03-14 (×2): qty 1

## 2022-03-14 MED ORDER — SODIUM CHLORIDE 0.9 % (FLUSH) INJECTION SYRINGE
3.0000 mL | INJECTION | Freq: Three times a day (TID) | INTRAMUSCULAR | Status: DC
Start: 2022-03-14 — End: 2022-03-14

## 2022-03-14 MED ORDER — FAMOTIDINE (PF) 20 MG/2 ML INTRAVENOUS SOLUTION
20.0000 mg | Freq: Once | INTRAVENOUS | Status: AC
Start: 2022-03-14 — End: 2022-03-14
  Administered 2022-03-14: 20 mg via INTRAVENOUS

## 2022-03-14 MED ORDER — BISACODYL 10 MG RECTAL SUPPOSITORY
10.0000 mg | Freq: Every day | RECTAL | Status: DC | PRN
Start: 2022-03-14 — End: 2022-03-15

## 2022-03-14 MED ORDER — ACETAMINOPHEN 325 MG TABLET
ORAL_TABLET | ORAL | Status: AC
Start: 2022-03-14 — End: 2022-03-14
  Filled 2022-03-14: qty 3

## 2022-03-14 MED ORDER — FENTANYL (PF) 50 MCG/ML INJECTION SOLUTION
INTRAMUSCULAR | Status: AC
Start: 2022-03-14 — End: 2022-03-14
  Filled 2022-03-14: qty 2

## 2022-03-14 MED ORDER — OXYCODONE ER 10 MG TABLET,CRUSH RESISTANT,EXTENDED RELEASE 12 HR
10.0000 mg | EXTENDED_RELEASE_ORAL_TABLET | Freq: Once | ORAL | Status: AC
Start: 2022-03-14 — End: 2022-03-14
  Administered 2022-03-14: 10 mg via ORAL
  Filled 2022-03-14: qty 1

## 2022-03-14 MED ORDER — MIDAZOLAM 5 MG/ML INJECTION WRAPPER
INTRAMUSCULAR | Status: AC
Start: 2022-03-14 — End: 2022-03-14
  Filled 2022-03-14: qty 1

## 2022-03-14 MED ORDER — ALBUTEROL SULFATE 2.5 MG/3 ML (0.083 %) SOLUTION FOR NEBULIZATION
2.5000 mg | INHALATION_SOLUTION | Freq: Once | RESPIRATORY_TRACT | Status: DC | PRN
Start: 2022-03-14 — End: 2022-03-14

## 2022-03-14 MED ORDER — ETHYL ALCOHOL 62 % (NOZIN NASAL SANITIZER) NASAL SOLUTION - BULK BOTTLE
1.0000 | Freq: Once | NASAL | Status: AC
Start: 2022-03-14 — End: 2022-03-14
  Administered 2022-03-14: 1 via NASAL

## 2022-03-14 MED ORDER — TRANEXAMIC ACID 1,000 MG/10 ML (100 MG/ML) INTRAVENOUS SOLUTION
INTRAVENOUS | Status: AC
Start: 2022-03-14 — End: 2022-03-14
  Filled 2022-03-14: qty 10

## 2022-03-14 MED ORDER — ONDANSETRON HCL (PF) 4 MG/2 ML INJECTION SOLUTION
8.0000 mg | Freq: Once | INTRAMUSCULAR | Status: AC
Start: 2022-03-14 — End: 2022-03-14
  Administered 2022-03-14: 8 mg via INTRAVENOUS

## 2022-03-14 MED ORDER — FERROUS SULFATE 324 MG (65 MG IRON) TABLET,DELAYED RELEASE
324.0000 mg | DELAYED_RELEASE_TABLET | Freq: Three times a day (TID) | ORAL | Status: DC
Start: 2022-03-14 — End: 2022-03-15
  Administered 2022-03-14 – 2022-03-15 (×4): 324 mg via ORAL
  Filled 2022-03-14 (×4): qty 1

## 2022-03-14 MED ORDER — DEXAMETHASONE SODIUM PHOSPHATE (PF) 10 MG/ML INJECTION SOLUTION
10.0000 mg | Freq: Once | INTRAMUSCULAR | Status: AC
Start: 2022-03-14 — End: 2022-03-14
  Administered 2022-03-14: 10 mg via INTRAVENOUS

## 2022-03-14 MED ORDER — FENTANYL (PF) 50 MCG/ML INJECTION WRAPPER
25.0000 ug | INJECTION | INTRAMUSCULAR | Status: DC | PRN
Start: 2022-03-14 — End: 2022-03-14

## 2022-03-14 MED ORDER — MORPHINE 2 MG/ML INJECTION WRAPPER
2.0000 mg | INJECTION | INTRAMUSCULAR | Status: DC | PRN
Start: 2022-03-14 — End: 2022-03-15

## 2022-03-14 MED ORDER — OXYCODONE-ACETAMINOPHEN 5 MG-325 MG TABLET
2.0000 | ORAL_TABLET | ORAL | Status: DC | PRN
Start: 2022-03-14 — End: 2022-03-15

## 2022-03-14 MED ORDER — SODIUM CHLORIDE 0.9 % INTRAVENOUS PIGGYBACK
INJECTION | INTRAVENOUS | Status: AC
Start: 2022-03-14 — End: 2022-03-14
  Filled 2022-03-14: qty 150

## 2022-03-14 MED ORDER — ROCURONIUM 10 MG/ML INTRAVENOUS SOLUTION
Freq: Once | INTRAVENOUS | Status: DC | PRN
Start: 2022-03-14 — End: 2022-03-14
  Administered 2022-03-14: 20 mg via INTRAVENOUS

## 2022-03-14 MED ORDER — ACETAMINOPHEN 325 MG TABLET
975.0000 mg | ORAL_TABLET | Freq: Four times a day (QID) | ORAL | Status: AC
Start: 2022-03-14 — End: 2022-03-14
  Administered 2022-03-14 (×2): 0 mg via ORAL

## 2022-03-14 MED ORDER — APREPITANT 40 MG CAPSULE
40.0000 mg | ORAL_CAPSULE | Freq: Once | ORAL | Status: AC
Start: 2022-03-14 — End: 2022-03-14
  Administered 2022-03-14: 40 mg via ORAL

## 2022-03-14 MED ORDER — LACTATED RINGERS INTRAVENOUS SOLUTION
INTRAVENOUS | Status: DC
Start: 2022-03-14 — End: 2022-03-15

## 2022-03-14 MED ORDER — CELECOXIB 100 MG CAPSULE
ORAL_CAPSULE | ORAL | Status: AC
Start: 2022-03-14 — End: 2022-03-14
  Filled 2022-03-14: qty 2

## 2022-03-14 MED ORDER — CELECOXIB 100 MG CAPSULE
200.0000 mg | ORAL_CAPSULE | Freq: Once | ORAL | Status: AC
Start: 2022-03-14 — End: 2022-03-14
  Administered 2022-03-14: 200 mg via ORAL

## 2022-03-14 MED ORDER — LIDOCAINE (PF) 100 MG/5 ML (2 %) INTRAVENOUS SYRINGE
INJECTION | Freq: Once | INTRAVENOUS | Status: DC | PRN
Start: 2022-03-14 — End: 2022-03-14
  Administered 2022-03-14: 60 mg via INTRAVENOUS

## 2022-03-14 MED ORDER — PHENYLEPHRINE 10 MG/ML INJECTION SOLUTION
Freq: Once | INTRAMUSCULAR | Status: DC | PRN
Start: 2022-03-14 — End: 2022-03-14
  Administered 2022-03-14: 100 ug via INTRAVENOUS
  Administered 2022-03-14: 50 ug via INTRAVENOUS

## 2022-03-14 MED ORDER — MIDAZOLAM 5 MG/ML INJECTION WRAPPER
2.0000 mg | Freq: Once | INTRAMUSCULAR | Status: DC | PRN
Start: 2022-03-14 — End: 2022-03-14
  Administered 2022-03-14: 2 mg via INTRAVENOUS

## 2022-03-14 MED ORDER — FENTANYL (PF) 50 MCG/ML INJECTION WRAPPER
50.0000 ug | INJECTION | INTRAMUSCULAR | Status: DC | PRN
Start: 2022-03-14 — End: 2022-03-14
  Administered 2022-03-14 (×2): 50 ug via INTRAVENOUS

## 2022-03-14 MED ORDER — SODIUM CHLORIDE 0.9 % (FLUSH) INJECTION SYRINGE
3.0000 mL | INJECTION | Freq: Three times a day (TID) | INTRAMUSCULAR | Status: DC
Start: 2022-03-14 — End: 2022-03-15
  Administered 2022-03-14 (×2): 0 mL
  Administered 2022-03-14 – 2022-03-15 (×3): 3 mL

## 2022-03-14 MED ORDER — BUSPIRONE 10 MG TABLET
10.0000 mg | ORAL_TABLET | Freq: Three times a day (TID) | ORAL | Status: DC
Start: 2022-03-14 — End: 2022-03-15
  Administered 2022-03-14 – 2022-03-15 (×4): 10 mg via ORAL
  Filled 2022-03-14 (×3): qty 1

## 2022-03-14 MED ORDER — SODIUM CHLORIDE 0.9 % (FLUSH) INJECTION SYRINGE
3.0000 mL | INJECTION | INTRAMUSCULAR | Status: DC | PRN
Start: 2022-03-14 — End: 2022-03-15

## 2022-03-14 MED ORDER — IPRATROPIUM 0.5 MG-ALBUTEROL 3 MG (2.5 MG BASE)/3 ML NEBULIZATION SOLN
3.0000 mL | INHALATION_SOLUTION | Freq: Once | RESPIRATORY_TRACT | Status: DC | PRN
Start: 2022-03-14 — End: 2022-03-14

## 2022-03-14 MED ORDER — SUCCINYLCHOLINE 20 MG/ML INTRAVENOUS WRAPPER
INJECTION | Freq: Once | INTRAVENOUS | Status: DC | PRN
Start: 2022-03-14 — End: 2022-03-14
  Administered 2022-03-14: 160 mg via INTRAVENOUS

## 2022-03-14 MED ORDER — TRAMADOL 50 MG TABLET
50.0000 mg | ORAL_TABLET | Freq: Four times a day (QID) | ORAL | Status: DC
Start: 2022-03-14 — End: 2022-03-15
  Administered 2022-03-14 – 2022-03-15 (×5): 50 mg via ORAL
  Filled 2022-03-14 (×5): qty 1

## 2022-03-14 MED ORDER — SODIUM CHLORIDE 0.9 % INTRAVENOUS SOLUTION
INTRAVENOUS | Status: AC
Start: 2022-03-14 — End: 2022-03-15

## 2022-03-14 MED ORDER — ASPIRIN 81 MG CHEWABLE TABLET
81.0000 mg | CHEWABLE_TABLET | Freq: Two times a day (BID) | ORAL | Status: DC
Start: 2022-03-15 — End: 2022-03-15
  Administered 2022-03-15: 81 mg via ORAL
  Filled 2022-03-14: qty 1

## 2022-03-14 MED ORDER — EPHEDRINE SULFATE 50 MG/ML INTRAVENOUS SOLUTION
Freq: Once | INTRAVENOUS | Status: DC | PRN
Start: 2022-03-14 — End: 2022-03-14
  Administered 2022-03-14: 10 mg via INTRAVENOUS

## 2022-03-14 MED ORDER — FAMOTIDINE 20 MG TABLET
20.0000 mg | ORAL_TABLET | Freq: Two times a day (BID) | ORAL | Status: DC
Start: 2022-03-14 — End: 2022-03-15
  Administered 2022-03-14 – 2022-03-15 (×3): 20 mg via ORAL
  Filled 2022-03-14 (×3): qty 1

## 2022-03-14 MED ORDER — LACTATED RINGERS INTRAVENOUS SOLUTION
INTRAVENOUS | Status: DC | PRN
Start: 2022-03-14 — End: 2022-03-14

## 2022-03-14 MED ORDER — OXYCODONE ER 10 MG TABLET,CRUSH RESISTANT,EXTENDED RELEASE 12 HR
20.0000 mg | EXTENDED_RELEASE_ORAL_TABLET | Freq: Once | ORAL | Status: AC
Start: 2022-03-14 — End: 2022-03-14
  Administered 2022-03-14: 20 mg via ORAL

## 2022-03-14 MED ORDER — ONDANSETRON HCL (PF) 4 MG/2 ML INJECTION SOLUTION
4.0000 mg | Freq: Once | INTRAMUSCULAR | Status: DC | PRN
Start: 2022-03-14 — End: 2022-03-14

## 2022-03-14 MED ORDER — LACTATED RINGERS INTRAVENOUS SOLUTION
INTRAVENOUS | Status: DC
Start: 2022-03-14 — End: 2022-03-14

## 2022-03-14 MED ORDER — OXYCODONE ER 10 MG TABLET,CRUSH RESISTANT,EXTENDED RELEASE 12 HR
EXTENDED_RELEASE_ORAL_TABLET | ORAL | Status: AC
Start: 2022-03-14 — End: 2022-03-14
  Filled 2022-03-14: qty 2

## 2022-03-14 MED ORDER — FAMOTIDINE (PF) 20 MG/2 ML INTRAVENOUS SOLUTION
INTRAVENOUS | Status: AC
Start: 2022-03-14 — End: 2022-03-14
  Filled 2022-03-14: qty 2

## 2022-03-14 MED ORDER — CHOLECALCIFEROL (VITAMIN D3) 25 MCG (1,000 UNIT) TABLET
1000.0000 [IU] | ORAL_TABLET | Freq: Every day | ORAL | Status: DC
Start: 2022-03-14 — End: 2022-03-15
  Administered 2022-03-14 – 2022-03-15 (×2): 1000 [IU] via ORAL
  Filled 2022-03-14 (×2): qty 1

## 2022-03-14 MED ORDER — ACETAMINOPHEN 325 MG TABLET
650.0000 mg | ORAL_TABLET | ORAL | Status: DC | PRN
Start: 2022-03-14 — End: 2022-03-15

## 2022-03-14 MED ORDER — OXYCODONE-ACETAMINOPHEN 5 MG-325 MG TABLET
1.0000 | ORAL_TABLET | ORAL | Status: DC | PRN
Start: 2022-03-14 — End: 2022-03-15

## 2022-03-14 MED ORDER — SODIUM CHLORIDE 0.9 % INTRAVENOUS PIGGYBACK
1000.0000 mg | INJECTION | Freq: Once | INTRAVENOUS | Status: AC
Start: 2022-03-14 — End: 2022-03-14
  Administered 2022-03-14: 1000 mg via INTRAVENOUS

## 2022-03-14 MED ORDER — ACETAMINOPHEN 325 MG TABLET
975.0000 mg | ORAL_TABLET | Freq: Once | ORAL | Status: AC
Start: 2022-03-14 — End: 2022-03-14
  Administered 2022-03-14: 975 mg via ORAL

## 2022-03-14 MED ORDER — DEXAMETHASONE SODIUM PHOSPHATE (PF) 10 MG/ML INJECTION SOLUTION
INTRAMUSCULAR | Status: AC
Start: 2022-03-14 — End: 2022-03-14
  Filled 2022-03-14: qty 1

## 2022-03-14 MED ORDER — PROPOFOL 10 MG/ML IV BOLUS
INJECTION | Freq: Once | INTRAVENOUS | Status: DC | PRN
Start: 2022-03-14 — End: 2022-03-14
  Administered 2022-03-14: 30 mg via INTRAVENOUS
  Administered 2022-03-14: 130 mg via INTRAVENOUS
  Administered 2022-03-14: 40 mg via INTRAVENOUS

## 2022-03-14 MED ORDER — ONDANSETRON HCL (PF) 4 MG/2 ML INJECTION SOLUTION
INTRAMUSCULAR | Status: AC
Start: 2022-03-14 — End: 2022-03-14
  Filled 2022-03-14: qty 4

## 2022-03-14 MED ORDER — CEFAZOLIN 1 GRAM SOLUTION FOR INJECTION
Freq: Once | INTRAMUSCULAR | Status: DC | PRN
Start: 2022-03-14 — End: 2022-03-14
  Administered 2022-03-14: 2000 mg via INTRAVENOUS

## 2022-03-14 MED ORDER — DOCUSATE SODIUM 100 MG CAPSULE
100.0000 mg | ORAL_CAPSULE | Freq: Two times a day (BID) | ORAL | Status: DC
Start: 2022-03-14 — End: 2022-03-15
  Administered 2022-03-14 – 2022-03-15 (×3): 100 mg via ORAL
  Filled 2022-03-14 (×3): qty 1

## 2022-03-14 MED ORDER — ENALAPRIL MALEATE 10 MG TABLET
10.0000 mg | ORAL_TABLET | Freq: Every day | ORAL | Status: DC
Start: 2022-03-14 — End: 2022-03-15
  Administered 2022-03-14 – 2022-03-15 (×2): 10 mg via ORAL
  Filled 2022-03-14 (×2): qty 1

## 2022-03-14 MED ORDER — GABAPENTIN 300 MG CAPSULE
300.0000 mg | ORAL_CAPSULE | Freq: Once | ORAL | Status: AC
Start: 2022-03-14 — End: 2022-03-14
  Administered 2022-03-14: 300 mg via ORAL

## 2022-03-14 MED ORDER — SODIUM CHLORIDE 0.9 % (FLUSH) INJECTION SYRINGE
3.0000 mL | INJECTION | INTRAMUSCULAR | Status: DC | PRN
Start: 2022-03-14 — End: 2022-03-14

## 2022-03-14 MED ORDER — ONDANSETRON HCL (PF) 4 MG/2 ML INJECTION SOLUTION
4.0000 mg | INTRAMUSCULAR | Status: DC | PRN
Start: 2022-03-14 — End: 2022-03-15
  Administered 2022-03-15: 4 mg via INTRAVENOUS
  Filled 2022-03-14: qty 2

## 2022-03-14 MED ORDER — MULTIVITAMIN-FERROUS FUMARATE-FOLIC ACID 18 MG-400 MCG TABLET
1.0000 | ORAL_TABLET | Freq: Every day | ORAL | Status: DC
Start: 2022-03-14 — End: 2022-03-15
  Administered 2022-03-14 – 2022-03-15 (×2): 1 via ORAL
  Filled 2022-03-14 (×2): qty 1

## 2022-03-14 MED ORDER — NALOXONE 0.4 MG/ML INJECTION SOLUTION
0.4000 mg | INTRAMUSCULAR | Status: DC | PRN
Start: 2022-03-14 — End: 2022-03-15

## 2022-03-14 MED ORDER — KETOROLAC 30 MG/ML (1 ML) INJECTION SOLUTION
15.0000 mg | Freq: Three times a day (TID) | INTRAMUSCULAR | Status: DC
Start: 2022-03-14 — End: 2022-03-15
  Administered 2022-03-14 – 2022-03-15 (×4): 15 mg via INTRAVENOUS
  Filled 2022-03-14 (×4): qty 1

## 2022-03-14 MED ORDER — FENTANYL (PF) 50 MCG/ML INJECTION WRAPPER
INJECTION | Freq: Once | INTRAMUSCULAR | Status: DC | PRN
Start: 2022-03-14 — End: 2022-03-14
  Administered 2022-03-14 (×3): 50 ug via INTRAVENOUS

## 2022-03-14 MED ORDER — METFORMIN 500 MG TABLET
500.0000 mg | ORAL_TABLET | Freq: Three times a day (TID) | ORAL | Status: DC
Start: 2022-03-14 — End: 2022-03-15
  Administered 2022-03-14 – 2022-03-15 (×4): 500 mg via ORAL
  Filled 2022-03-14 (×4): qty 1

## 2022-03-14 MED ORDER — CEFAZOLIN 1 GRAM SOLUTION FOR INJECTION
INTRAMUSCULAR | Status: AC
Start: 2022-03-14 — End: 2022-03-14
  Filled 2022-03-14: qty 20

## 2022-03-14 MED ORDER — CEFAZOLIN 1 GRAM SOLUTION FOR INJECTION
2.0000 g | Freq: Once | INTRAMUSCULAR | Status: DC
Start: 2022-03-14 — End: 2022-03-14

## 2022-03-14 MED ORDER — APREPITANT 40 MG CAPSULE
ORAL_CAPSULE | ORAL | Status: AC
Start: 2022-03-14 — End: 2022-03-14
  Filled 2022-03-14: qty 1

## 2022-03-14 MED ORDER — PROCHLORPERAZINE EDISYLATE 10 MG/2 ML (5 MG/ML) INJECTION SOLUTION
5.0000 mg | Freq: Once | INTRAMUSCULAR | Status: DC | PRN
Start: 2022-03-14 — End: 2022-03-14

## 2022-03-14 MED ORDER — ETHYL ALCOHOL 62 % (NOZIN NASAL SANITIZER) NASAL SOLUTION - BULK BOTTLE
1.0000 | Freq: Three times a day (TID) | NASAL | Status: DC
Start: 2022-03-14 — End: 2022-03-15
  Administered 2022-03-14 – 2022-03-15 (×4): 1 via NASAL

## 2022-03-14 MED ORDER — LACTATED RINGERS INTRAVENOUS SOLUTION
INTRAVENOUS | Status: DC
Start: 2022-03-14 — End: 2022-03-15
  Administered 2022-03-14: 0 mL via INTRAVENOUS

## 2022-03-14 MED ORDER — GABAPENTIN 300 MG CAPSULE
ORAL_CAPSULE | ORAL | Status: AC
Start: 2022-03-14 — End: 2022-03-14
  Filled 2022-03-14: qty 1

## 2022-03-14 MED ORDER — ROPIVACAINE (PF) 2 MG/ML (0.2 %) INJECTION SOLUTION
INTRAMUSCULAR | Status: AC
Start: 2022-03-14 — End: 2022-03-14
  Filled 2022-03-14: qty 20

## 2022-03-14 MED ORDER — HYDROMORPHONE 2 MG/ML INJECTION WRAPPER
0.2000 mg | INJECTION | INTRAMUSCULAR | Status: DC | PRN
Start: 2022-03-14 — End: 2022-03-15
  Administered 2022-03-14: 0.2 mg via INTRAVENOUS
  Filled 2022-03-14: qty 1

## 2022-03-14 MED ORDER — SUGAMMADEX 100 MG/ML INTRAVENOUS SOLUTION
Freq: Once | INTRAVENOUS | Status: DC | PRN
Start: 2022-03-14 — End: 2022-03-14
  Administered 2022-03-14: 200 mg via INTRAVENOUS

## 2022-03-14 SURGICAL SUPPLY — 99 items
ADH SKNCLS CYNCRLT SKINSTITCH LIQUID PREC APPL TIP NONST LF  DISP .5ML (SUTURE/WOUND CLOSURE) ×2 IMPLANT
ADH SKNCLS GLU .5ML (SUTURE/WOUND CLOSURE) ×4
BAG BIOHAZ RD 30X24IN THK3 MIL C8-10GL LLDPE INFCT WASTE CAN (MED SURG SUPPLIES) ×4
BAG BIOHAZ RD 30X24IN THK3 MIL C8-10GL LLDPE INFCT WASTE CAN PNCT RST (MED SURG SUPPLIES) ×2
BAG BIOHAZ RD 43X30IN THK3 MIL C20-30GL LLDPE INFCT WASTE (MED SURG SUPPLIES) ×4
BAG BIOHAZ RD 43X30IN THK3 MIL C20-30GL LLDPE INFCT WASTE CAN PNCT RST (MED SURG SUPPLIES) ×2 IMPLANT
BAG SUT DVN STRL LF (SUTURE/WOUND CLOSURE) ×1 IMPLANT
BAG SUTURE DEVON STERILE LATEX FREE (SUTURE/WOUND CLOSURE) ×2
BANDAGE ELT 5.8YDX4IN NONST SLFCLS ELAS KNIT TEAL END STCH (WOUND CARE/ENTEROSTOMAL SUPPLY) ×4
BANDAGE ELT 5.8YDX4IN NONST SLFCLS ELAS KNIT TEAL END STCH VELCRO COTTON POLY BLND STD LGTH COMPRESS (WOUND CARE SUPPLY) ×2 IMPLANT
BANDAGE ELT 5.8YDX6IN NONST SLFCLS ELAS KNIT STRCH VELCRO (WOUND CARE/ENTEROSTOMAL SUPPLY) ×4
BANDAGE ELT 5.8YDX6IN NONST SLFCLS ELAS KNIT STRCH VELCRO COTTON POLY BLND STD LGTH COMPRESS BGE HNY (WOUND CARE SUPPLY) ×2 IMPLANT
BASEPLATE TIB TRIATH 3 KNEE TRIT (IMPLANTS KNEE) ×2 IMPLANT
BLADE 10 2 END CBNSTL SURG STRL DISP (CUTTING ELEMENTS) ×8
BLADE 10 2 END CBNSTL SURG STRL DISP (SURGICAL CUTTING SUPPLIES) ×8 IMPLANT
BLADE SAW 1.9X2.4IN SGTL FLR SS THK.025IN W THN STRL (SURGICAL CUTTING SUPPLIES) IMPLANT
BLADE SAW 2.4X1.9IN SGTL FLR THK.025IN W (CUTTING ELEMENTS)
BLADE SAW 90X25X1.27MM SGTL STRL LF (CUTTING ELEMENTS) ×2
BLADE SAW 90X25X1.27MM SGTL STRL LF (SURGICAL CUTTING SUPPLIES) ×2 IMPLANT
CLEANER INSTR PREPZYME MUL-TRD CONTAINR NARSL NEUT PH BDGR (MISCELLANEOUS PT CARE ITEMS) ×2
CONV USE ITEM 321837 - GLOVE SURG 7.5 LTX PF NONST CRM (GLOVES AND ACCESSORIES) ×2 IMPLANT
CONV USE ITEM 321852 - GLOVE SURG 6.5 LF  PLISPRN (GLOVES AND ACCESSORIES) ×1 IMPLANT
CONV USE ITEM 321983 - GLOVE SURG 8 LTX CHEMO PF SMOOTH BEAD CUF STRL WHT 11.6IN PLMR THK.2MM THK.21MM (GLOVES AND ACCESSORIES) ×2 IMPLANT
CONV USE ITEM 323185 - PAD EG 15SQ IN UNIV FOAM SPLT NONCORD ADULT 9100 SER (SURGICAL CUTTING SUPPLIES) ×1 IMPLANT
CONV USE ITEM 329146 - CLEANER INSTR PREPZYME MUL-TRD CONTAINR NARSL NEUT PH BDGR 22OZ (MISCELLANEOUS PT CARE ITEMS) ×1 IMPLANT
CONV USE ITEM 338662 - PACK SURG ASCP STRL DISP ~~LOC~~ BPT MED CNTR LF (CUSTOM TRAYS & PACK) ×2 IMPLANT
CONV USE ITEM 34153 - ELECTRODE ESURG BLADE PNCL 3/32IN STRL SS CAUT PSHBTN STD SHAFT LF  VEGA SER (SURGICAL CUTTING SUPPLIES) ×1 IMPLANT
CONV USE ITEM 81225 - BAG BIOHAZ RD 30X24IN THK3 MIL C8-10GL LLDPE INFCT WASTE CAN PNCT RST (MED SURG SUPPLIES) ×2 IMPLANT
COUNTER 20 CNT BLOCK ADH NEEDLE STRL LF  RD SHARP FOAM 15.75X11.5X14IN DISP (MED SURG SUPPLIES) ×1 IMPLANT
COUNTER 20 CNT BLOCK ADH NEEDLE STRL LF RD SHARP FOAM 15.75 (MED SURG SUPPLIES) ×2
COVER TBL 90X50IN STD SMS REINF FNFLD STRL LF  DISP (DRAPE/PACKS/SHEETS/OR TOWEL) ×4 IMPLANT
COVER TBL 90X50IN STD SMS REINF FNFLD STRL LF DISP (DRAPE/PACKS/SHEETS/OR TOWEL) ×8
DEVICE SUCT 2 FILTER CHAMBER SCKR STRL LF  DISP (MED SURG SUPPLIES) ×2 IMPLANT
DEVICE SUT STRATAFIX PDS + 45C_M ABS KNTLS TISS CONTROL SMTR (SUTURE/WOUND CLOSURE) ×4
DRAPE 2 INCS FILM ANTIMIC 23X2_3IN IOBN STRL SURG (DRAPE/PACKS/SHEETS/OR TOWEL) ×4
DRAPE ABS REINF 2 ELAS FENESTRATE HKLP LINE HLDR CONTOUR ARMBRD CVR 138X107X90IN BILAT LIMB PRXM LF (DRAPE/PACKS/SHEETS/OR TOWEL) ×1 IMPLANT
DRAPE ABS REINF 2 ELAS FENESTR_ATE HKLP LINE HLDR CONTOUR (DRAPE/PACKS/SHEETS/OR TOWEL) ×2
DRAPE FNFLD ABS REINF 77X53IN 43528 PRXM LF  STRL DISP SURG SMS 44X23IN (DRAPE/PACKS/SHEETS/OR TOWEL) ×2 IMPLANT
DRAPE FNFLD ABS REINF 77X53IN_43528 PRXM LF STRL DISP SURG (DRAPE/PACKS/SHEETS/OR TOWEL) ×4
DRAPE INCS ANTIMIC 23X23IN IOBN2 TRNSPR (DRAPE/PACKS/SHEETS/OR TOWEL) ×2 IMPLANT
DRESS 12X4IN PS MPLX BR FOAM ACUTE SURG WOUND (WOUND CARE SUPPLY) ×2 IMPLANT
DRESS 12X4IN PS MPLX BR FOAM ACUTE SURG WOUND (WOUND CARE/ENTEROSTOMAL SUPPLY) ×2
DRESS COMPRESS 13FTX6IN JNS COTTON RL HI ABS LF  STRL .5LB (WOUND CARE SUPPLY) ×2 IMPLANT
DRESS COMPRESS 13FTX6IN JNS COTTON RL HI ABS LF STRL .5LB (WOUND CARE/ENTEROSTOMAL SUPPLY) ×4
DURAPREP 26ML 8630 CS/20 (MED SURG SUPPLIES) ×4
ELECTRODE ESURG BLADE PNCL 3/32IN STRL SS CAUT PSHBTN STD (CUTTING ELEMENTS) ×2
FEM 3 PA CRCTE RTN BEAD TRIATH KNEE LFT COM STRL LF (IMPLANTS KNEE) ×1 IMPLANT
FEM 3 PA CRCTE RTN BEAD TRIATH KNEE RGT COM STRL LF (IMPLANTS KNEE) ×1 IMPLANT
GLOVE SURG 6.5 LF  PF BEAD CUF SMOOTH TXTR STRL GRN 12IN SENSICARE PLISPRN SYN PLMR ALOE THK7.9 MIL (GLOVES AND ACCESSORIES) ×1 IMPLANT
GLOVE SURG 6.5 LF PF BEAD CUF SMOOTH TXTR STRL GRN 12IN (GLOVES AND ACCESSORIES) ×2
GLOVE SURG 6.5 LF PF SMOOTH STRL WHT PLISPRN (GLOVES AND ACCESSORIES) ×2
GLOVE SURG 6.5 LTX PF BEAD CUF MICRO ROUGHEN N-PYRG STRL STRW BGL SRG CURVE FINGER (GLOVES AND ACCESSORIES) ×3 IMPLANT
GLOVE SURG 6.5 LTX PF BEAD CUF_MICRO RGH N-PYRG STRL STRW (GLOVES AND ACCESSORIES) ×6
GLOVE SURG 7 LF  PF SMOOTH TXTR BEAD CUF STRL GRN 12IN SENSICARE PI PLISPRN SYN PLMR ALOE THK7.9 MIL (GLOVES AND ACCESSORIES) ×3 IMPLANT
GLOVE SURG 7 LF PF SMOOTH TXTR BEAD CUF STRL GRN 12IN (GLOVES AND ACCESSORIES) ×6
GLOVE SURG 7.5 LF  PF SMOOTH BEAD CUF STRL GRN 12IN SENSICARE PI GRN PLISPRN PLMR ALOE THK7.9 MIL (GLOVES AND ACCESSORIES) ×2 IMPLANT
GLOVE SURG 7.5 LF PF SMOOTH BEAD CUF STRL GRN 12IN (GLOVES AND ACCESSORIES) ×4
GLOVE SURG 7.5 LTX PF SMOOTH STRL CRM (GLOVES AND ACCESSORIES) ×4
GLOVE SURG 8 LTX PF SMOOTH STRL CRM (GLOVES AND ACCESSORIES) ×4
GOWN SURG XL STD LGTH L3 HKLP CLSR RGLN SLEEVE TWL STRL LF (DRAPE/PACKS/SHEETS/OR TOWEL) ×2
GOWN SURG XL STD LGTH L3 HKLP CLSR RGLN SLEEVE TWL STRL LF  DISP GRN AERO BLU PRFRM FBRC (DRAPE/PACKS/SHEETS/OR TOWEL) ×1 IMPLANT
GOWN SURG XL XLNG L4 REINF HKLP CLSR SET IN SLEEVE STRL LF (DRAPE/PACKS/SHEETS/OR TOWEL) ×4
GOWN SURG XL XLNG L4 REINF HKLP CLSR SET IN SLEEVE STRL LF  DISP BLU SIRUS SMS PE 56IN (DRAPE/PACKS/SHEETS/OR TOWEL) ×2 IMPLANT
HANDPC PLS LAV INTPLS SUCT FAN SPRAY TIP (MED SURG SUPPLIES) ×2
HDPE THK22 UM C40-45 GL L48 IN X W40 IN NATURAL (MISCELLANEOUS PT CARE ITEMS) ×6 IMPLANT
HEMOSTAT ABS 14X2IN FLXB SHR W_V SRGCL STRL DISP (WOUND CARE SUPPLY) ×2 IMPLANT
HEMOSTAT ABS 14X2IN FLXB SHR W_V SRGCL STRL DISP (WOUND CARE/ENTEROSTOMAL SUPPLY) ×2
INSERT TIB BRNG CRCTE RTN 3 9MM TRIATH X3 KNEE STRL LF (IMPLANTS KNEE) ×2 IMPLANT
LABEL MED CORRECT MED LABELING SYS 4 FLG 2 SHEET 24 PRPRNT (MED SURG SUPPLIES) ×2
LABEL MED CORRECT MED LABELING SYS 4 FLG 2 SHEET 24 PRPRNT STRL (MED SURG SUPPLIES) ×1 IMPLANT
MATTRESS TRANSF 34IN HOVERMATT BRTHBL NONST LF  DISP (MED SURG SUPPLIES) ×1 IMPLANT
MATTRESS TRANSF 34IN HOVERMATT_BRTHBL NONST LF DISP (MED SURG SUPPLIES) ×2
NEEDLE 1.5IN 18GA POLYPROP FIL LL HUB DEHP-FR STRL BLUNT BD REG WL LF  DISP RD (MED SURG SUPPLIES) ×2 IMPLANT
NEEDLE 1.5IN 18GA POLYPROP FIL_LL HUB DEHP-FR STRL BLUNT BD (MED SURG SUPPLIES) ×4
PACK ARTHROGRAM MDLN INDUSTRIES INC. RAD GEN N/M S DISP (CUSTOM TRAYS & PACK) ×4
PACK SURG ASCP STRL DISP ~~LOC~~ BPT MED CNTR LF (CUSTOM TRAYS & PACK) ×2
PAD EG 15SQ IN UNIV FOAM SPLT NONCORD ADULT 9100 SER (CUTTING ELEMENTS) ×2
PATEL 9MM 29MM TRIT METAL ASYM TRIATH KNEE COM STRL (IMPLANTS KNEE) ×2 IMPLANT
SEALER ESURG .226IN .137IN AQUAMANTYS 6 30D .236IN SPC BIPOLAR 2 ELECTRODE HMST 5.1IN STRL LF  DISP (SURGICAL CUTTING SUPPLIES) ×1 IMPLANT
SEALER ESURG .226IN .137IN AQU_AMANTYS 6 TRANSCOLLATION 30D (CUTTING ELEMENTS) ×2
SET INTPLS SUCT TUBE FAN SPRAY TIP HANDPC STRL LF  DISP (MED SURG SUPPLIES) ×1 IMPLANT
SOL IRRG 0.9% NACL 2000ML PRSV FR N-PYRG FLXB CONTAINR STRL (MEDICATIONS/SOLUTIONS) ×2
SOL IRRG 0.9% NACL 2000ML PRSV FR N-PYRG FLXB CONTAINR STRL LF (MEDICATIONS/SOLUTIONS) ×1 IMPLANT
SOL IV 0.9% NACL 500ML PLASTIC CONTAINR VIAFLEX LF (MEDICATIONS/SOLUTIONS) ×1 IMPLANT
SOL SURG PREP 26ML DRPRP 74% ISPRP 0.7% IOD POVACRYLEX SLF CNTN APPL SKIN STRL PREOP (MED SURG SUPPLIES) ×2 IMPLANT
SOLUTION IV NS INJ 500CC_2B1323Q 24/CS (MEDICATIONS/SOLUTIONS) ×2
SPONGE LAP 18X18IN PREWASH RIGID TRY STRL LF  WHT (MED SURG SUPPLIES) ×2 IMPLANT
SPONGE LAP 18X18IN PREWASH RIGID TRY STRL LF WHT (MED SURG SUPPLIES) ×4
SUTURE 1 CT STRATAFIX PDS + 18IN VIOL ABS KNOTLESS TISS CONTROL SMTR ABS (SUTURE/WOUND CLOSURE) ×2 IMPLANT
SUTURE 1 GS-24 POLYSRB 36IN VIOL BRD COAT ABS (SUTURE/WOUND CLOSURE) ×18 IMPLANT
SUTURE 2-0 GS-21 POLYSRB 30IN UNDYED BRD COAT ABS (SUTURE/WOUND CLOSURE) ×3 IMPLANT
SUTURE 2-0 GS-21 POLYSRB_30IN BRD COAT ABS (SUTURE/WOUND CLOSURE) ×6
SUTURE 3-0 C-13 BIOSYN 30IN UNDYED MONOF ABS (SUTURE/WOUND CLOSURE) ×4 IMPLANT
SUTURE 4-0 C-14 SURGIPRO2 18IN BLU MONOF NONAB (SUTURE/WOUND CLOSURE) ×8 IMPLANT
SYRINGE LL 20ML STRL GRAD MED (MED SURG SUPPLIES) ×4
SYRINGE LL 20ML STRL GRAD MED DISP (MED SURG SUPPLIES) ×2 IMPLANT
TOWEL 24X16IN COTTON BLU DISP SURG STRL LF (DRAPE/PACKS/SHEETS/OR TOWEL) ×8 IMPLANT
WOUND IRRG IRRISEPT DBRD CLNSG 0.05% CHG SYSTEM STRL LF (WOUND CARE SUPPLY) ×2 IMPLANT
WOUND IRRG IRRISEPT DBRD CLNSG_0.05% CHG SYSTEM STRL LF (WOUND CARE/ENTEROSTOMAL SUPPLY) ×4

## 2022-03-14 NOTE — Care Plan (Signed)
Problem: Adult Inpatient Plan of Care  Goal: Patient-Specific Goal (Individualized)  Outcome: Ongoing (see interventions/notes)  Flowsheets (Taken 03/14/2022 1129)  Individualized Care Needs: ADL assist while recovering from surgery, pain control.  Anxieties, Fears or Concerns: Anxious to work with therapy.  Patient-Specific Goals (Include Timeframe): Wants to do well with therapy and go home tomorrow.  Plan of Care Reviewed With: patient   Patient continues to receive PRN and scheduled medications, IV fluids, physical therapy, patient education regarding fall risk prevention and recent surgery, and discharge planning remains ongoing.

## 2022-03-14 NOTE — Anesthesia Postprocedure Evaluation (Signed)
Anesthesia Post Op Evaluation    Patient: Monique Kline  Procedure(s):  BILATERAL TOTAL KNEE ARTHROPLASTIES USING STRYKER PRESSFIT TRIATHLON IMPLANTS    Last Vitals:Temperature: 36.3 C (97.3 F) (03/14/22 1015)  Heart Rate: 72 (03/14/22 1015)  BP (Non-Invasive): 114/71 (03/14/22 1015)  Respiratory Rate: 16 (03/14/22 1015)  SpO2: 100 % (03/14/22 1015)    No notable events documented.    Patient is sufficiently recovered from the effects of anesthesia to participate in the evaluation and has returned to their pre-procedure level.  Patient location during evaluation: PACU       Patient participation: complete - patient participated  Level of consciousness: awake and alert and responsive to verbal stimuli    Pain management: adequate  Airway patency: patent    Anesthetic complications: no  Cardiovascular status: acceptable  Respiratory status: acceptable  Hydration status: acceptable  Patient post-procedure temperature: Pt Normothermic   PONV Status: Absent

## 2022-03-14 NOTE — Anesthesia Preprocedure Evaluation (Signed)
ANESTHESIA PRE-OP EVALUATION  Planned Procedure: BILATERAL TOTAL KNEE ARTHROPLASTIES USING STRYKER AS1 IMPLANTS (Bilateral: Knee)  Review of Systems     anesthesia history negative               Pulmonary  negative pulmonary ROS,    Cardiovascular    Hypertension and ECG reviewed ,No peripheral edema,  Exercise Tolerance: > or = 4 METS        GI/Hepatic/Renal    GERD        Endo/Other    obesity,   type 2 diabetes/ controlled with oral medications    Neuro/Psych/MS    headaches, anxiety, depression     Cancer                      Physical Assessment      Airway       Mallampati: III    TM distance: <3 FB                Dental       Dentition intact             Pulmonary    Breath sounds clear to auscultation       Cardiovascular    Rhythm: regular  Rate: Normal  (-) no friction rub, carotid bruit is not present, no peripheral edema and no murmur     Other findings            Plan  ASA 3     Planned anesthesia type: general                           Anesthesia issues/risks discussed are: Sore Throat, Dental Injuries, Aspiration, Stroke, PONV, Nerve Injuries and Cardiac Events/MI.  Anesthetic plan and risks discussed with patient             Patient's NPO status is appropriate for Anesthesia.           (Patient declines spinal)

## 2022-03-14 NOTE — OR Nursing (Signed)
PRE OP MEASURE    RT   AK  25"           BK  14 1/4"    LT     AK  26 5/8"            BK 14 5/8"    20 ML NAROPIN 0.2% TO EACH KNEE JOINT SPACE AS CLOSING  [ TOTAL 40 ML]

## 2022-03-14 NOTE — OR Surgeon (Signed)
[facility]   OPERATIVE REPORT  PATIENT NAME:  Monique Kline, Monique Kline  MRN:  [id number]  DOB:  [birth year]      DATE OF PROCEDURE:  03/14/2022   PRE OP DIAGNOSIS:  Osteoarthritis of the knees bilaterally  POST OP DIAGNOSIS: Same   PROCEDURE: Bilateral total knee replacement  COMPONENTS:    Implant Name Type Inv. Item Serial No. Manufacturer Lot No. LRB No. Used Action   FEM 3 PA CRCTE RTN BEAD LFT_TRIA KN COM - [id number]  FEM 3 PA CRCTE RTN BEAD LFT_TRIA KN COM  [employer] [id number] Left 1 Implanted   INSERT TIB BRNG CRCTE RTN 3 9M_M TRIATH KNEE STRL - [id number]  INSERT TIB BRNG CRCTE RTN 3 9M_M TRIATH KNEE STRL  [employer] [id number] Left 1 Implanted   BASEPLATE TIB TRIATH 3 KNEE TR_IT - [id number]  BASEPLATE TIB TRIATH 3 KNEE TR_IT  [employer] [id number] Left 1 Implanted   PATEL  MTL ASYM TRIT K_N COM - [id number]  PATEL  MTL ASYM TRIT K_N COM  [employer] [id number] Left 1 Implanted   FEM 3 PA CRCTE RTN BEAD TRIA K_N RGT COM - [id number]  FEM 3 PA CRCTE RTN BEAD TRIA K_N RGT COM  [employer] [id number] Right 1 Implanted   INSERT TIB BRNG CRCTE RTN 3 9M_M TRIATH KNEE STRL - [id number]  INSERT TIB BRNG CRCTE RTN 3 9M_M TRIATH KNEE STRL  [employer] [id number] Right 1 Implanted   BASEPLATE TIB TRIATH 3 KNEE TR_IT - [id number]  BASEPLATE TIB TRIATH 3 KNEE TR_IT  [employer] [id number] Right 1 Implanted   PATEL  MTL ASYM TRIT K_N COM - [id number]  PATEL  MTL ASYM TRIT K_N COM  [employer] [id number] Right 1 Implanted        MANUFACTURER: [employer]  ANESTHESIA TYPE:  PBanesthesia1: General    SURGEON RIGHT:   Henriette Combs DO    ASSISTING SURGEON RIGHT:  Quenton Fetter  FIRST ASSISTANT:  PBfirstassist: Almyra Free  SURGEON LEFT: Cori Razor MD  ASSISTING SURGEON LEFT:  Janyth Pupa Higinbotham  FIRST ASSISTANT:  PBfirstassist: Johnnie Blankenship  ANTIBIOTIC:  Kefzol   ANTICOAGULANT:  ASA   ESTIMATED BLOOD LOSS: Minimal  Specimens: * No specimens in log *   DESCRIPTION OF THE  PROCEDURE RIGHT:  Patient identified and time out completed with entire surgical team. Antibiotics and tranexamic acid administered prior to inflation of toruniquet.  Satisfactory anesthesia induced.  Tourniquet was placed over padding on the thigh.  Both legs were prepped with DuraPrep and draped in a sterile manner.  Elevation, exsanguination, and tourniquet to 350 mmHg.     Procedure was performed with 2 separate operating teams working simultaneously.  Start time staggered between the knees to allow for the contralateral surgeon to assist, particularly at critical stages of the procedure establishing final component alignment and rotation.  Anterior approach to the knee through medial parapatellar arthrotomy.  Intramedullary femoral cutting guide, 4 degrees valgus, 8 mm resection, distal cut performed.  Sizing guide in neutral rotation, component chosen.  Anterior, posterior, and chamfer cuts performed.  ACL parted sharply.  Tibia subluxed anteriorly.  Medial release performed.  Extramedullary tibial cutting guide referencing 9 mm from the high side, perpendicular with anatomic axis, 3 degrees posterior slope.  Resection performed.  Marginal osteophytes removed from the tibia, posterior osteophytes from the femur, notch debrided.  A baseplate trial was positioned with an insert and femur.  Full extension and flexion achieved with good rotation,  alignment, and stability.  The four auxiliary holes drilled in the tibia.  Patellar articular surface resected; component chosen.  Drill holes performed.  Trial placed with nice tracking and good medial contact.  All trials removed.  Irrigation followed.  The baseplate was impacted into position with appropriate insert snapped into place.  The femoral component was then impacted and the patella compressed into position.  The knee was taken through range of motion and stable.  All components with nice bony contact.  Wound was irrigated copiously with Irrisept.  The  arthrotomy was closed with #1 Vicryl in interrupted fashion, subcutaneous tissue, and skin in layers.  Sterile Jones bandage was applied.  Tourniquet was released.  The patient was brought to recovery room in stable condition.  DESCRIPTION OF THE PROCEDURE LEFT:  Patient identified and time out completed with entire surgical team. Antibiotics and tranexamic acid administered prior to inflation of toruniquet.  Satisfactory anesthesia induced.  Tourniquet was placed over padding on the thigh.  Both legs were prepped with DuraPrep and draped in a sterile manner.  Elevation, exsanguination, and tourniquet to 350 mmHg.     Anterior approach to the knee through medial parapatellar arthrotomy.  Intramedullary femoral cutting guide, 4 degrees valgus, 8 mm resection, distal cut performed.  Sizing guide in neutral rotation, component chosen.  Anterior, posterior, and chamfer cuts performed.  ACL parted sharply.  Tibia subluxed anteriorly.  Medial release performed.  Extramedullary tibial cutting guide referencing 9 mm from the high side, perpendicular with anatomic axis, 3 degrees posterior slope.  Resection performed.  Marginal osteophytes removed from the tibia, posterior osteophytes from the femur, notch debrided.  A baseplate trial was positioned with an insert and femur.  Full extension and flexion achieved with good rotation, alignment, and stability.  The four auxiliary holes drilled in the tibia.  Patellar articular surface resected; component chosen.  Drill holes performed.  Trial placed with nice tracking and good medial contact.  All trials removed.  Irrigation followed.  The baseplate was impacted into position with appropriate insert snapped into place.  The femoral component was then impacted and the patella compressed into position.  The knee was taken through range of motion and stable.  All components with nice bony contact.  Wound was irrigated copiously with Irrisept.  The arthrotomy was closed with #1  Vicryl in interrupted fashion, subcutaneous tissue, and skin in layers.  Sterile Jones bandage was applied.  Tourniquet was released.  The patient was brought to recovery room in stable condition.  Quenton Fetter, DO  03/14/2022, 12:02   This note was partially generated using MModal Fluency Direct system, and there may be some incorrect words, spellings, and punctuation that were not noted in checking the note before saving.

## 2022-03-14 NOTE — Interval H&P Note (Signed)
H & P updated the day of the procedure.  1.  H&P completed within 30 days of surgical procedure and has been reviewed within 24 hours of admission but prior to surgery or a procedure requiring anesthesia services, the patient has been examined, and no change has occured in the patients condition since the H&P was completed.       Change in medications: No        No LMP recorded.      Comments:     2.  Patient continues to be appropriate candidate for planned surgical procedure. YES    Rudolph Daoust, DO

## 2022-03-14 NOTE — PT Evaluation (Signed)
[facility]  [place], [address]  [phone]  [phone]  Rehabilitation Services  Physical Therapy Inpatient TKA Initial Evaluation    Patient Name: Monique Kline  Date of Birth: 11/13/1965  Height: Height: 154.9 cm (_0 )  Weight: Weight: 97.5 kg (215 lb)  Room/Bed: 372/A  Payor: [place] / Plan: [place] / Product Type: Medicaid [place] /       PMH:  Past Medical History:   Diagnosis Date    Anxiety     Depression     Diabetes mellitus, type 2 (CMS HCC)     GERD (gastroesophageal reflux disease)     Hypertension     Migraine            Assessment:      (P) Patient is a 56 y/o female s/p bilateral TKAs. 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 Patient's daughter will be staying with her upon d/c to provide 24/7 caregiver assist. PTA, patient was Mod I utilizing a cane at baseline. Denies recent history of falls. At eval, patient completed bilateral UE MMT within functional limits; bilateral LE AROM limited by pain inhibition. Pt completed bed mobility with Mod A and increased time. Attempted sit-to-stand transition but was unable to complete as patient with increased c/o of dizziness/nausea and stated "I need to lay down." Further OOB not attempted this date due to poor tolerance of sitting trial. Patient is appropriate for acute PT services. Recommend d/c home with 24/7 caregiver assist, use of RW as AD, and follow-up with outpatient PT if acute goals are met.    Discharge Needs:    Equipment Recommendation: (P) front wheeled walker      The patient presents with mobility limitations due to impaired balance, impaired range of motion, impaired strength, and impaired functional activity tolerance that significantly impair/prevent patient's ability to participate in mobility-related activities of daily living (MRADLs) including  ambulation and transfers in order to safely  complete, toileting, bathing, food preparation, laundering/household tasks, safely entering/exiting the home. This functional mobility deficit can be sufficiently resolved with the use of a (P) front wheeled walker  in order to decrease the risk of falls, morbidity, and mortality in performance of these MRADLs.  Patient is able to safely use this assistive device.    Discharge Disposition: (P) home with 24/7 assistance, home with outpatient services, TBD    JUSTIFICATION OF DISCHARGE RECOMMENDATION   Based on current diagnosis, functional performance prior to admission, and current functional performance, this patient requires continued PT services in (P) home with 24/7 assistance, home with outpatient services, TBD in order to achieve significant functional improvements in these deficit areas: (P) gait, locomotion, and balance, muscle performance, ROM (range of motion), neuromuscular.        Plan:   Current Intervention: (P) balance training, bed mobility training, gait training, home exercise program, joint mobilization, manual therapy techniques, neuromuscular re-education, patient/family education, postural re-education, ROM (range of motion), stair training, strengthening, stretching, transfer training  To provide physical therapy services (P)  (1-3x/day Monday-Saturday)  for duration of (P) until discharge.    The risks/benefits of therapy have been discussed with the patient/caregiver and he/she is in agreement with the established plan of care.       Subjective & Objective     Past Medical History:   Diagnosis Date    Anxiety  Depression     Diabetes mellitus, type 2 (CMS HCC)     GERD (gastroesophageal reflux disease)     Hypertension     Migraine             Past Surgical History:   Procedure Laterality Date    HX APPENDECTOMY      HX CARPAL TUNNEL RELEASE Right     HX HYSTERECTOMY      HX TRIGGER FINGER RELEASE Left     thumb    HX TUBAL LIGATION                   03/14/22 1422   Rehab Session    Document Type evaluation   Total PT Minutes: 28   Patient Effort adequate   Symptoms Noted During/After Treatment dizziness;fatigue;increased pain;nausea   General Information   Patient Profile Reviewed yes   Medical Lines Telemetry;PIV Line   Respiratory Status nasal cannula   Existing Precautions/Restrictions fall precautions;full code   General Observations of Patient Drowsy and cooperative   Weight-bearing Status   Extremity Weight-bearing Status left lower extremity;right lower extremity   Left Lower Extremity weight-bearing as tolerated (WBAT)   Right Lower Extremity weight-bearing as tolerated (WBAT)   Mutuality/Individual Preferences   Anxieties, Fears or Concerns Concerned about pain with mobility.   Plan of Care Reviewed With patient   Living Environment   Lives With parent(s)   Living Arrangements house   Home Assessment: No Problems Identified   Home Accessibility no concerns;ramps present at home   [facility] with ramp to entry   Functional Level Prior   Ambulation 1 - assistive equipment   Transferring 1 - assistive equipment   Toileting 1 - assistive equipment   Bathing 1 - assistive equipment   Dressing 1 - assistive equipment   Eating 0 - independent   Communication 0 - understands/communicates without difficulty   Prior Functional Level Comment Mod I utilizing a cane at baseline for MRADLs   Pre Treatment Status   Pre Treatment Patient Status Patient supine in bed;Call light within reach;Telephone within reach   Support Present Pre Treatment  None   Communication Pre Treatment  Nurse   Communication Pre Treatment Comment RN Richardson Landry cleared pt for PT eval   Cognitive Assessment/Interventions   Behavior/Mood Observations behavior appropriate to situation, WNL/WFL   Orientation Status oriented x 4   Vital Signs   Pre-Treatment Heart Rate (beats/min) 86   Post-treatment Heart Rate (beats/min) 80   Pre SpO2 (%) 100   O2 Delivery Pre Treatment supplemental O2   Post SpO2 (%) 99   O2  Delivery Post Treatment supplemental O2   Pain Assessment   Pretreatment Pain Rating 10/10   Posttreatment Pain Rating 10/10   Pre/Posttreatment Pain Comment Max 10/10 pain at bilateral knees; RN present to administer rx   RUE Assessment   RUE Assessment WFL- Within Functional Limits   LUE Assessment   LUE Assessment WFL- Within Functional Limits   RLE Assessment   RLE Assessment X-Exceptions   RLE Other AROM limited by pain inhibition; 4+/5 distally at plantar/dorsiflexors   LLE Assessment   LLE Assessment X-Exceptions   LLE Other AROM limited by pain inhibition; 4+/5 distally at plantar/dorsiflexors   Trunk Assessment   Trunk Assessment WFL-Within Functional Limits   Bed Mobility Assessment/Treatment   Bed Mobility, Assistive Device Head of Bed Elevated;overhead trapeze   Supine-Sit Independence moderate assist (50% patient effort)   Sit to Supine, Independence moderate  assist (50% patient effort)   Safety Issues decreased use of legs for bridging/pushing;impaired trunk control for bed mobility   Impairments pain;ROM decreased;strength decreased   Transfer Assessment/Treatment   Sit-Stand Independence unable to perform   Stand-Sit Independence unable to perform   Gait Assessment/Treatment   Total Distance Ambulated 0   Independence  unable to perform;not tested   Balance Skill Training   Sitting Balance: Static fair + balance   Sitting, Dynamic (Balance) fair + balance   Sit-to-Stand Balance unable to balance   Systems Impairment Contributing to Balance Disturbance neuromuscular;musculoskeletal   Identified Impairments Contributing to Balance Disturbance pain;decreased ROM;decreased strength   Post Treatment Status   Post Treatment Patient Status Patient supine in bed;Telephone within reach;Call light within reach   Support Present Post Treatment  Family present   Communication Post Treatement Nurse   Communication Post Treatment Comment Patient semi-supine in bed with all expressed needs met. Call bell within  reach, bed alarm ON. Family present.   Plan of Care Review   Plan Of Care Reviewed With patient;family   Functional Impairment   Overall Functional Impairments/Problem List balance impaired;endurance;pain;ROM decreased;strength decreased   Physical Therapy Clinical Impression   Assessment Patient is a 56 y/o female s/p bilateral TKAs. 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 Patient's daughter will be staying with her upon d/c to provide 24/7 caregiver assist. PTA, patient was Mod I utilizing a cane at baseline. Denies recent history of falls. At eval, patient completed bilateral UE MMT within functional limits; bilateral LE AROM limited by pain inhibition. Pt completed bed mobility with Mod A and increased time. Attempted sit-to-stand transition but was unable to complete as patient with increased c/o of dizziness/nausea and stated "I need to lay down." Further OOB not attempted this date due to poor tolerance of sitting trial. Patient is appropriate for acute PT services. Recommend d/c home with 24/7 caregiver assist, use of RW as AD, and follow-up with outpatient PT if acute goals are met.   Criteria for Skilled Therapeutic skilled treatment is necessary   Pathology/Pathophysiology Noted musculoskeletal;neuromuscular   Impairments Found (describe specific impairments) gait, locomotion, and balance;muscle performance;ROM (range of motion);neuromuscular   Functional Limitations in Following  self-care;home management;community/leisure   Rehab Potential good   Therapy Frequency   (1-3x/day Monday-Saturday)   Predicted Duration of Therapy Intervention (days/wks) until discharge   Anticipated Equipment Needs at Discharge (PT) front wheeled walker   Anticipated Discharge Disposition home with 24/7 assistance;home with outpatient services;TBD   Evaluation Complexity Justification   Clinical Decision Making Moderate complexity   Evaluation Complexity Moderate complexity    Planned Therapy Interventions, PT Eval   Planned Therapy Interventions (PT) balance training;bed mobility training;gait training;home exercise program;joint mobilization;manual therapy techniques;neuromuscular re-education;patient/family education;postural re-education;ROM (range of motion);stair training;strengthening;stretching;transfer training   Psychosocial Support   Trust Relationship/Rapport care explained;emotional support provided   (INSERT FLOWSHEET)    TOTAL KNEE ARTHROPLASTY (TKA) PHYSICAL THERAPY GOALS    1) AMBULATE 300 FEET WITH WALKER AND Modified Independence.   2) TRANSFER BED TO CHAIR WITH Modified Independence.   3) INCREASED STRENGTH TO 3+/5 KNEE EXTENSION.   4) INCREASED KNEE AAROM TO 4 TOP 90 DEGREES.  5) NEGOTIATES TRAINING STEPS USING HANDRAIL AND Modified Independence.       Physical Therapy Inpatient TKA D/C Criteria        PATIENT/CAREGIVER EDUCATION  Educated on exercise program? NO  Can successfully demonstrate exercise form? NO  Can demonstrate safe/effective assistance with functional mobility? NO     IMPAIRMENT BASED CRITERIA  Does the patient demonstrate knee ROM of at least 8-75 degrees on the involved leg? NO  Does the patient demonstrate a minimum of 3+/5 quadricep strength? NO  Does the patient demonstrate the ability to maintain any weight bearing precautions? NO  Does the patient demonstrated sensation of all LE dermatomes? NO  Does the patient have adequate pain control of <4/10 for functional mobility at home? NO    FUNCTIONAL BASED CRITERIA  Does that patient demonstrate the ability to ambulate 80 feet with RW using CGA/SPV assistance? NO  Does the patient demonstrate the ability to perform bed mobility with no more than min assist? NO  Does the patient demonstrated the ability to sit to stand from the bed or chair with min assist? NO  Does the patient demonstrate the ability to walk up/down 3-5 stairs with min assist as required for home entry? NO    ROM  Extension  4  Flexion 90        INTERVENTION MINUTES: EVALUATION 28 minutes    EVALUATION COMPLEXITY : CLINICAL DECISION MAKING OF MODERATE COMPLEXITY AS INDICATED BY PMHX, PHYSICAL THERAPY ASSESSMENT OF MUSCULOSKELETAL AND NEUROLOGICAL SYSTEMS AND ACTIVITY LIMITATIONS. CLINICAL PRESENTATION HAS CHANGING CHARACTERISTICS.    Therapist:     Ferol Luz, PT  03/14/2022, 16:04  Sion Thane Neldon Labella, PT 03/14/2022 16:04

## 2022-03-14 NOTE — Anesthesia Transfer of Care (Signed)
ANESTHESIA TRANSFER OF CARE   Monique Kline is a 56 y.o. ,female, Weight: 97.5 kg (215 lb)   had Procedure(s):  BILATERAL TOTAL KNEE ARTHROPLASTIES USING STRYKER PRESSFIT TRIATHLON IMPLANTS  performed  03/14/22   Primary Service: Quenton Fetter, DO    Past Medical History:   Diagnosis Date    Anxiety     Depression     Diabetes mellitus, type 2 (CMS HCC)     Hypertension     Migraine       Allergy History as of 03/14/22       CODEINE         Noted Status Severity Type Reaction    03/14/22 6979 Tresa Endo, RN 03/14/22 Active    Other Adverse Reaction (Add comment)    Comments: Insomnia                 MUSHROOM         Noted Status Severity Type Reaction    03/14/22 4801 Tresa Endo, RN 03/14/22 Active Low  Hives/ Urticaria              ACETAMINOPHEN-CODEINE         Noted Status Severity Type Reaction    03/14/22 6553 Tresa Endo, RN 03/14/22 Active Low  Hives/ Urticaria                  I completed my transfer of care / handoff to the receiving personnel during which we discussed:  Access, All key/critical aspects of case discussed, Antibiotics, Fluids/Product, Labs, PMHx, Gave opportunity for questions and acknowledgement of understanding, Expectation of post procedure, Analgesia and Airway      Post Location: PACU                        Additional Info:Monique Kline is a 56 y.o.  transported to PACU post operatively in stable condition.  All questions were answered for the PACU team and signout was given to the PACU nurse, including but not limited to, significant past medical history, allergies, clinical course during the surgery and post-operative considerations.    Tresa Moore, DO  Anesthesiology  03/14/22                                          Last OR Temp: Temperature: 36.2 C (97.2 F)  ABG:  POTASSIUM   Date Value Ref Range Status   02/24/2022 3.9 3.5 - 5.1 mmol/L Final     KETONES   Date Value Ref Range Status   02/24/2022 Negative Negative, Trace mg/dL Final     CALCIUM   Date Value Ref  Range Status   02/24/2022 10.0 8.6 - 10.3 mg/dL Final     Calculated P Axis   Date Value Ref Range Status   02/24/2022 70 degrees Final     Calculated R Axis   Date Value Ref Range Status   02/24/2022 6 degrees Final     Calculated T Axis   Date Value Ref Range Status   02/24/2022 24 degrees Final     Airway:* No LDAs found *  Blood pressure 116/84, pulse 67, temperature 36.2 C (97.2 F), resp. rate 17, height 1.549 m (5\' 1" ), weight 97.5 kg (215 lb), SpO2 100 %.

## 2022-03-14 NOTE — H&P (Signed)
Paper H and P on chart, will be scanned to EMR.

## 2022-03-15 LAB — CBC WITH DIFF
BASOPHIL #: 0 10*3/uL (ref 0.00–0.30)
BASOPHIL %: 0 % (ref 0–3)
EOSINOPHIL #: 0 10*3/uL (ref 0.00–0.80)
EOSINOPHIL %: 0 % (ref 0–7)
HCT: 32.2 % — ABNORMAL LOW (ref 37.0–47.0)
HGB: 10.7 g/dL — ABNORMAL LOW (ref 12.5–16.0)
LYMPHOCYTE #: 2.6 10*3/uL (ref 1.10–5.00)
LYMPHOCYTE %: 29 % (ref 25–45)
MCH: 29 pg (ref 27.0–32.0)
MCHC: 33.3 g/dL (ref 32.0–36.0)
MCV: 87 fL (ref 78.0–99.0)
MONOCYTE #: 0.8 10*3/uL (ref 0.00–1.30)
MONOCYTE %: 9 % (ref 0–12)
MPV: 8 fL (ref 7.4–10.4)
NEUTROPHIL #: 5.6 10*3/uL (ref 1.80–8.40)
NEUTROPHIL %: 62 % (ref 40–76)
PLATELETS: 294 10*3/uL (ref 140–440)
RBC: 3.7 10*6/uL — ABNORMAL LOW (ref 4.20–5.40)
RDW: 14 % (ref 11.6–14.8)
WBC: 9.1 10*3/uL (ref 4.0–10.5)
WBCS UNCORRECTED: 9.1 10*3/uL

## 2022-03-15 LAB — BASIC METABOLIC PANEL
ANION GAP: 10 mmol/L (ref 10–20)
BUN/CREA RATIO: 21 (ref 6–22)
BUN: 16 mg/dL (ref 7–25)
CALCIUM: 8.8 mg/dL (ref 8.6–10.3)
CHLORIDE: 103 mmol/L (ref 98–107)
CO2 TOTAL: 22 mmol/L (ref 21–31)
CREATININE: 0.77 mg/dL (ref 0.60–1.30)
ESTIMATED GFR: 90 mL/min/{1.73_m2} (ref >59)
GLUCOSE: 140 mg/dL — ABNORMAL HIGH (ref 74–109)
OSMOLALITY, CALCULATED: 274 mOsm/kg (ref 270–290)
POTASSIUM: 3.7 mmol/L (ref 3.5–5.1)
SODIUM: 135 mmol/L — ABNORMAL LOW (ref 136–145)

## 2022-03-15 LAB — POC BLOOD GLUCOSE (RESULTS)
GLUCOSE, POC: 137 mg/dl (ref 50–500)
GLUCOSE, POC: 105 mg/dl (ref 50–500)

## 2022-03-15 MED ORDER — HYDROCODONE 7.5 MG-ACETAMINOPHEN 325 MG TABLET
1.0000 | ORAL_TABLET | Freq: Four times a day (QID) | ORAL | 0 refills | Status: AC | PRN
Start: 2022-03-15 — End: ?

## 2022-03-15 NOTE — Care Plan (Signed)
Problem: Adult Inpatient Plan of Care  Goal: Plan of Care Review  Outcome: Ongoing (see interventions/notes)  Goal: Patient-Specific Goal (Individualized)  Outcome: Ongoing (see interventions/notes)  Goal: Absence of Hospital-Acquired Illness or Injury  Outcome: Ongoing (see interventions/notes)  Goal: Optimal Comfort and Wellbeing  Outcome: Ongoing (see interventions/notes)  Goal: Rounds/Family Conference  Outcome: Ongoing (see interventions/notes)     Problem: Fall Injury Risk  Goal: Absence of Fall and Fall-Related Injury  Outcome: Ongoing (see interventions/notes)     Problem: Knee Arthroplasty  Goal: Optimal Coping  Outcome: Ongoing (see interventions/notes)  Goal: Absence of Bleeding  Outcome: Ongoing (see interventions/notes)  Goal: Effective Bowel Elimination  Outcome: Ongoing (see interventions/notes)  Goal: Fluid and Electrolyte Balance  Outcome: Ongoing (see interventions/notes)  Goal: Optimal Functional Ability  Outcome: Ongoing (see interventions/notes)  Goal: Absence of Infection Signs and Symptoms  Outcome: Ongoing (see interventions/notes)  Goal: Intact Neurovascular Status  Outcome: Ongoing (see interventions/notes)  Goal: Anesthesia/Sedation Recovery  Outcome: Ongoing (see interventions/notes)  Goal: Acceptable Pain Control  Outcome: Ongoing (see interventions/notes)  Goal: Nausea and Vomiting Relief  Outcome: Ongoing (see interventions/notes)  Goal: Effective Urinary Elimination  Outcome: Ongoing (see interventions/notes)  Goal: Effective Oxygenation and Ventilation  Outcome: Ongoing (see interventions/notes)   Patient has not worked with PT. She refused  to get up. She is urinating incontinently. Pain is being controlled. Dressing dry and in tacked bilaterally. High fall risk patient bed check in use. Possible rehab placement.

## 2022-03-15 NOTE — PT Treatment (Signed)
[facility]  [address]  [place], [address]  [phone]  (Fax) [phone]  Rehabilitation Department  Physical Therapy Daily Inpatient Note    Date: 03/15/2022  Patient's Name: Monique Kline  Date of Birth: 1966/06/15  Height: Height: 154.9 cm (5\' 1" )  Weight: Weight: 97.5 kg (215 lb)      Plan: Will continue under current POC.         Subjective/Objective/Assessment:  Flowsheet    03/15/22 1402   Rehab Session   Document Type therapy progress note (daily note)   Total PT Minutes: 18   Patient Effort good   General Information   Patient Profile Reviewed yes   Medical Lines PIV Line   Existing Precautions/Restrictions fall precautions   Weight-bearing Status   Extremity Weight-bearing Status left lower extremity;right lower extremity   Left Lower Extremity weight-bearing as tolerated (WBAT)   Right Lower Extremity weight-bearing as tolerated (WBAT)   Pre Treatment Status   Pre Treatment Patient Status Patient sitting in bedside chair or w/c   Support Present Pre Treatment  Family present   Communication Pre Treatment  Charge Nurse   Cognitive Assessment/Interventions   Behavior/Mood Observations behavior appropriate to situation, WNL/WFL   Orientation Status oriented x 4   Attention WNL/WFL   Vital Signs   Vitals Comment vitals stable   Pain Assessment   Pain Intervention  PRN Medication   Posttreatment Pain Rating 8/10   Transfer Assessment/Treatment   Sit-Stand Independence contact guard assist   Stand-Sit Independence contact guard assist   Sit-Stand-Sit, Assist Device walker, front wheeled   Gait Assessment/Treatment   Total Distance Ambulated 200   Independence  contact guard assist   Assistive Device  walker, front wheeled   Impairments  endurance;pain;ROM decreased;strength decreased   Comment working on step thru gait, right le buckled twice today when walking   Post Treatment Status   Post Treatment Patient Status Patient sitting in bedside chair or w/c   Support  Present Post Treatment  Family present   05/15/22 Nurse   Physical Therapy Clinical Impression   Assessment patient stood with cga, gaited with rw 268ft, buckled twice, needed 4 standing rests this afternoon, patient returned to the chair with needs in reach, chair alarmed as well                 Intervention minutes: GAIT TRAINING [address] MINUTES    THERAPIST  [address], PTA  03/15/2022, 14:51

## 2022-03-15 NOTE — PT Treatment (Signed)
[facility]  [address]  [place], [address]  [phone]  (Fax) [phone]  Rehabilitation Department  Physical Therapy Daily Inpatient TKA Note    Date: 03/15/2022  Patient's Name: Monique Kline  Date of Birth: 1966-08-02  Height: Height: 154.9 cm (5\' 1" )  Weight: Weight: 97.5 kg (215 lb)      Plan: Will continue under current POC.         Subjective/Objective/Assessment:  Flowsheet    03/15/22 0815   Rehab Session   Document Type therapy progress note (daily note)   Total PT Minutes: 65   Patient Effort good   General Information   Patient Profile Reviewed yes   Medical Lines Telemetry;PIV Line   Existing Precautions/Restrictions fall precautions   Weight-bearing Status   Extremity Weight-bearing Status left lower extremity;right lower extremity   Left Lower Extremity weight-bearing as tolerated (WBAT)   Right Lower Extremity weight-bearing as tolerated (WBAT)   Pre Treatment Status   Pre Treatment Patient Status Patient sitting in bedside chair or w/c   Support Present Pre Treatment  None   Communication Pre Treatment  Charge Nurse   Cognitive Assessment/Interventions   Behavior/Mood Observations behavior appropriate to situation, WNL/WFL   Orientation Status oriented x 4   Attention WNL/WFL   Vital Signs   Vitals Comment vitals stable   Pain Assessment   Pain Intervention  PRN Medication   Pretreatment Pain Rating 8/10   Posttreatment Pain Rating 8/10   Transfer Assessment/Treatment   Sit-Stand Independence contact guard assist   Stand-Sit Independence knee block   Sit-Stand-Sit, Assist Device walker, front wheeled   Transfer Impairments endurance;pain;ROM decreased;strength decreased   Gait Assessment/Treatment   Total Distance Ambulated 200   Independence  contact guard assist   Assistive Device  walker, front wheeled   Impairments  endurance;pain;ROM decreased;strength decreased   Comment working on step thru gait   Stairs Assessment/Treatment   Number of Stairs 2    Handrail Location both sides   Comment cga   Balance Skill Training   Sitting Balance: Static good balance   Sitting, Dynamic (Balance) good balance   Sit-to-Stand Balance fair + balance   Standing Balance: Static fair + balance   Therapeutic Exercise/Activity   Comment patient particpated with group therapy, did well with exs, unable to do slr's or srq's, reviewed walking, icing, and positioningto prevent excess swelling, no lob noted,  rom left 3-70 and right 5-66   Functional Impairment   Overall Functional Impairments/Problem List balance impaired;endurance;pain;ROM decreased;strength decreased   Physical Therapy Clinical Impression   Assessment patient standingfrom the chair with cga of 1, gaited with rw 210ft, standing rests needed , no lob, did 2 steps with cga, did well wiht ex program, no family present during training...               Physical Therapy Inpatient TKA D/C Criteria        PATIENT/CAREGIVER EDUCATION  Educated on exercise program? YES  Can successfully demonstrate exercise form? YES  Can demonstrate safe/effective assistance with functional mobility? YES     IMPAIRMENT BASED CRITERIA  Does the patient demonstrate knee ROM of at least 8-75 degrees on the involved leg? YES  Does the patient demonstrate a minimum of 3+/5 quadricep strength? NO  Does the patient demonstrate the ability to maintain any weight bearing precautions? YES  Does the patient demonstrated sensation of all LE dermatomes? YES  Does the patient have adequate pain control of <4/10 for  functional mobility at home? NO    FUNCTIONAL BASED CRITERIA  Does that patient demonstrate the ability to ambulate 80 feet with RW using CGA/SPV assistance? YES  Does the patient demonstrate the ability to perform bed mobility with no more than min assist? COMMENT did not observe  Does the patient demonstrated the ability to sit to stand from the bed or chair with min assist? YES  Does the patient demonstrate the ability to walk up/down 3-5  stairs with min assist as required for home entry? COMMENT did 2    ROM  Extension left 3 and right 5  Flexion left 70 and right [address]      THERAPIST  Lane Hacker, PTA  03/15/2022, 11:34       Intervention minutes: GROUP THERAPEUTIC EXERCISE 50 MINUTES and GAIT TRAINING [address] MINUTES    THERAPIST  Lane Hacker, PTA  03/15/2022, 11:34

## 2022-03-15 NOTE — Progress Notes (Signed)
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    03/14/2022    1 Day Post-Op  -------------------   Quenton Fetter, DO  Phone Number: [phone]   Weight-bearing Status  Extremity Weight-bearing Status: left lower extremity, right lower extremity  Left Lower Extremity: weight-bearing as tolerated (WBAT)  Right Lower Extremity: weight-bearing as tolerated (WBAT)   Gait Assessment/Treatment  Total Distance Ambulated: 200  Independence : contact guard assist  Assistive Device : walker, front wheeled  Impairments : endurance, pain, ROM decreased, strength decreased  Comment: working on step thru gait, right le buckled twice today when walking  Anticoagulants (last 24 hours)       None           Patient Lines/Drains/Airways Status       Active Line / Dialysis Catheter / Dialysis Graft / Drain / Airway / Wound       Name Placement date Placement time Site Days    Peripheral IV Left Basilic  (medial side of arm) 03/14/22  0609  -- 1    Surgical Incision Bilateral Knee 03/14/22  0830  -- 1                   CBC:     9.1 (08/02 0526) \   10.7* (08/02 0526) /   294 (08/02 3716)      / 32.2* (08/02 0526) \           BMP:   135* (08/02 0526) 103 (08/02 0526) 16 (08/02 0526)    /     140* (08/02 0526)   3.7 (08/02 0526) 22 (08/02 0526) 0.77 (08/02 0526) \         Meets DC criteria.  Exam WNL.  Dressings dry.  Questions answered.     DC>

## 2022-03-15 NOTE — Nurses Notes (Signed)
Drsg removed to both knees dry and intact.left 21/18/12 and right 22/17/12.pt refused to ambulate this shift.

## 2022-03-15 NOTE — Care Management Notes (Signed)
[facility] Management Initial Evaluation    Patient Name: Monique Kline  Date of Birth: 27-Jan-1966  Sex: female  Date/Time of Admission: 03/14/2022  5:41 AM  Room/Bed: 372/A  Payor: [place] / Plan: [place] / Product Type: Medicaid MC /   Primary Care Providers:  Zadie Rhine, [place], [place] (General)    Pharmacy Info:   Preferred [place], [place]    [place] [address]    Phone: [phone] Fax: [phone]    Hours: Not open 24 hours          Emergency Contact Info:   Extended Emergency Contact Information  Primary Emergency Contact: Katheran James [place] Phone: [phone]  Work Phone: [phone]  Mobile Phone: [phone]  Relation: Mother    History:   Monique Kline is a 54 y.o., female, admitted 03/14/22.    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  Yes        Name of Insurance Coverage for Medications [employer] Medicaid   Financial Concerns none   Living Environment   Select an age group to open "lives with" row.  Adult   Living Arrangements house   Able to Return to Prior Arrangements yes   IEP and/or 504 Plan? No   Home Safety   Home Assessment: No Problems Identified   Home Accessibility ramps present at home;bed and bath on same level   Custody and Legal Status   Do you have a court appointed guardian/conservator? No   Are you an emancipated minor? No   Custody Issues? No   Paternity Affidavit Requested? No   Care Management Plan   Discharge Planning Status initial meeting   Projected Discharge Date 03/15/22    Discharge plan discussed with: Patient   CM will evaluate for rehabilitation potential yes   Patient choice offered to patient/family yes   Facility or Agency Preferences [facility]   Discharge Needs Assessment   Outpatient/Agency/Support Group Needs other (see comments)   Equipment Currently Used at [facility] commode;walker, front wheeled   Equipment Needed After Discharge none   Discharge Facility/Level of Care Needs Home (Patient/Family Member/other)(code 1)   Transportation Available car;family or friend will provide   Referral Information   Admission Type observation   Arrived From home or self-care     Pt admitted with total bilateral knee arthroplasty. CM met with pt at bedside. Pt was alert and oriented to all spheres. Pt lives at home and plans to return there upon discharge. Pt has no steps to get into her home and her home is all one level. There is a ramp at the home. Pt will have her daughter, [name], to assist in her care. Pt has a walker and a bedside commode. Pt would like to have outpatient physical therapy at [facility]. CM faxed referral to [facility] and pt is scheduled for 03/16/22 at 2:30 PM.     Discharge  Plan:  Home (Patient/Family Member/other) (code 1)      The patient will continue to be evaluated for developing discharge needs.     Case Manager: Doroteo Bradford, [place]  Phone: [phone]

## 2022-03-16 ENCOUNTER — Other Ambulatory Visit: Payer: Self-pay

## 2022-03-16 ENCOUNTER — Ambulatory Visit (HOSPITAL_COMMUNITY)
Admission: RE | Admit: 2022-03-16 | Discharge: 2022-03-16 | Disposition: A | Discharge status: Still In Hospital | Payer: MEDICAID | Source: Ambulatory Visit

## 2022-03-16 DIAGNOSIS — Z96653 Presence of artificial knee joint, bilateral: Secondary | ICD-10-CM

## 2022-03-16 NOTE — PT Evaluation (Signed)
[facility]  Outpatient Physical Therapy  [place], [address]  [phone]  [phone]      Physical Therapy Lower Extremity Evaluation    Date: 03/16/2022  Patient's Name: Monique Kline  Date of Birth: [birth year]    PT diagnosis/Reason for Referral: Bilateral TKA                    SUBJECTIVE  Date of onset: 03/14/22    Mechanism of injury: Leading up to the knee replacements, she had a back injury at work a few years ago, which then resulted in increased pressure on her hips and knees. Prior to surgery, she reports having bone on bone pain in bilateral knees limiting function.     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 Over the last couple months he has been bed riddin. She lives in a one level home with a steep ramp into the home. Tub shower with no shower chair/bench. Will need one. She was mobilizing with a cane prior to surgery.   Currently her daughter is staying with her for a week or so to assist with cares.     Previous episodes/treatments: No PT prior, attended pre surgery class.     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     Next MD visit: Unsure on exact date, however is scheduled     Pain location: Bilateral knees                     Pain description: ACHING and BURNING    Pain frequency:  CONTINUOUS    Pain rating: Now 10   Best 8   Worst 10    Radiculopathy: No    Pain increases with: POSITION CHANGE, ADLs, ACTIVITY, STAND, SIT, and WALK           decreases with : REST    Sensation: Intact     Weakness: Generalized weakness bilaterally due to surgerry     Sleep affected: Yes     Subjective Functional Reports:    Sitting: LIMITED    Standing: LIMITED    Walking: LIMITED    Lifting: LIMITED      Patient-Specific Functional Score:    Problem Score   1. Getting in/out  of bed  0   2. Getting in/out of care 0   3. Walking  3   Total 1           OBJECTIVE    AROM in supine    right left   Knee Flexion 25  22    Knee Extension -20  -20     PROM Right knee flexion 45 degrees bilaterally   ROM comments Lacks approximately 40-45 degrees with seated LAQ.   Circumference: Left mid patella: 19.5 inches, Right mid patella 19.75 inches     Strength     right left   Hip abduction 2- 2-   Hip adduction 2- 2-   Knee flexion  2- 2-   Knee extension  2- 2-   Ankle DF  5 5       Gait:  Ambulates with a walker, partial step through gait pattern, slowed cadence, poor knee ROM in swing phase     Timed up and Go: 1  minute 45 seconds with walker support.     Bed mobility: Required Mod to Max Assist for sit<>supine due to significant pain, weakness, and ROM limitations.     Sit<>stands: Very guarded and painful with transitional movements, taking extra time to complete.       Treatment provided:REVIEW OF POC AND GOALS WITH PATIENT, ALL QUESTIONS ANSWERED, PATIENT EDUCATION, and THERAPEUTIC EXERCISE   Access Code: [email or web address]  URL: [email or web address]  Date: 03/16/2022  Prepared by: Parks Neptune    Program Notes  1) Complete exercises at least 3x daily, or more if able2) Get up and walk every 1-2 hours with walker3) Ice for 20 minutes every hour or so as able4) Try to elevate the leg as able for 7-10 days after surgery5) Even on days of therapy, continue to work with exercises, icing, and walking at home!6) Weather the storm!  The first few weeks will be very challenging, then you will see improvements every day and be happy you got this surgery!  Exercises  - Supine Quad Set  - 4 x daily - 7 x weekly - 1 sets - 12 reps - 3 hold  - Supine Heel Slide with Strap  - 4 x daily - 7 x weekly - 1 sets - 12 reps - 3 hold  - Seated Long Arc Quad  - 4 x daily - 7 x weekly - 1 sets - 10 reps  - Seated Heel Toe Raises  - 4 x daily - 7 x weekly - 1 sets - 12 reps - 2 hold  - Seated March  - 4 x daily - 7 x  weekly - 1 sets - 10 reps - 2 hold  - Seated Knee Extension Stretch with Chair  - 4 x daily - 7 x weekly - 1 sets - 1 reps - 30 hold          ASSESSMENT    Impression: Ahnesty is a 56 year old female who is 2 days s/p bilateral TKA. She presents with moderate edema of bilateral knees, significant pain and limitations in active/passive range of motion. She is also presenting with increased difficulties with mobility including sit to stands, transferring in/out of bed, and ambulation. She does have her daughter who is staying with her for at least a week to assist at home with cares and assist with exercises. She had poor tolerance to initial exercises for range of motion, which is shown with the significant limitations in range this date. She will greatly benefit from skilled PT services to progress with functional range of motion, strength, and mobility for optimal results post surgery.     Rehab potential: GOOD    Short Term Goals: 4 Weeks   -Increase bilateral knee AROM/PROM to 90 degrees or greater.    -Strength of bilateral LE WFL for supine SLR without extension lag.    -LE strength WFL for static stance with symmetric LE wb without pain.  -LE strength WFL for sit to stand with minimal increase in pain.    -Intermittent vs. constant pain. Worst SPS rating less than [6/10].   -Patient will be independent in HEP   -Improve TUG with walker support to 45 seconds or less.    -Will be able to complete sit<>supine with SBA demonstrating improvements in strength.       Long Term Goals: 8 Weeks  -LE strength and ROM WFL to allow for normal body mechanics with mobility.  -LE strength WFL for symmetric LE WB during sit  to stand without UE assist.   -Resume community ambulation with use of a cane with normalized gait mechanics.    -Sleep not disrupted by LE pain. Worst SPS rating less than 3/10.  -LE strength / ROM WFL for patient to negotiate steps reciprocally for community mobility.    -Report improved functional  activities at home with PSFS from 1/10 to 7/10 or greater.         PLAN  Patient will attend 2-3 times per week x 8 weeks. Therapy may include, but is not limited to THERAPEUTIC EXERCISES, MYOFASCIAL/JOINT MOBILIZATION, POSTURE/BODY MECHANICS, ERGONOMIC TRAINING, TRANSFER/GAIT TRAINING, HOME INSTRUCTIONS, HEAT/COLD, and NEURO RE-EDUCATIOIN    Plan for next visit Continue with bilateral knee ROM/Strengthening        Evaluation complexity:   Personal factors impacting POC: UNUSUAL DOMESTIC DEMANDS (IE CARING FOR SPOUSE/CHILD/PARENT)   Co-morbidities impacting POC: OBESITY and Diabetes   Complexity of physical exam: INCLUDING MUSCULOSKELETAL SYSTEM (POSTURE, ROM, STRENGTH, HEIGHT/WEIGHT) and INCLUDING NEUROMUSCULAR EXAM (BALANCE, GAIT, LOCOMOTION, MOBILITY)   Clinical Presentation: STABLE   Evaluation Complexity: LOW-HISTORY 0, EXAMINATION 1-2, STABLE PRESENTATION      Total Session Time 45, Timed code minutes 15, and Untimed code minutes 30         Intervention minutes: EVALUATION 30 minutes and THERAPEUTIC EXERCISE 15 minutes    Parks Neptune, PT  03/16/2022, 14:41      Start of Service: _________          Certification:    From:______  Through:_________    I certify the need for these services furnished under this plan of treatment and while under my care.    Referring Provider Signature: _______________     Date : _____________________

## 2022-03-17 ENCOUNTER — Other Ambulatory Visit (HOSPITAL_COMMUNITY): Payer: Self-pay | Admitting: Orthopaedic Surgery

## 2022-03-17 DIAGNOSIS — Z96653 Presence of artificial knee joint, bilateral: Secondary | ICD-10-CM

## 2022-03-20 ENCOUNTER — Other Ambulatory Visit: Payer: Self-pay

## 2022-03-20 ENCOUNTER — Ambulatory Visit (HOSPITAL_COMMUNITY)
Admission: RE | Admit: 2022-03-20 | Discharge: 2022-03-20 | Disposition: A | Discharge status: Still In Hospital | Payer: MEDICAID | Source: Ambulatory Visit

## 2022-03-20 NOTE — PT Treatment (Signed)
[facility] Medicine [facility]  Outpatient Physical Therapy  [address]  [place], [address]  [phone]  (Fax) [phone]    Physical Therapy Treatment Note    Date: 03/20/2022  Patient's Name: Monique Kline  Date of Birth: [birth year]    Visit #/POC: 2/ up to 24  Authorization:  POC Signed?: No  POC Ends: 9/28  Next Progress Note Due: 04/07/22    Evaluating Physical Therapist: Louretta Parma, DPT  PT diagnosis/Reason for Referral: Bilateral TKA   Next Scheduled Physician Appointment: 03/24/22  Allergies/Contraindications: None       Subjective: Reports 7/10 pain in the lobby, 9/10 after walking back to therapy gym. She had a good weekend overall, states that she is having trouble sleeping due to numbness/pain lateral right hip to knee. Compliant with exercising.     Objective:   Left knee: 55 degrees AROM and 60 degrees PROM Flexion  Right Knee: 45 degrees AROM 51 degrees PROM flexion.     EXERCISE/ACTIVITY NAME REPETITIONS RESISTANCE COMPLETED THIS DOS   Nustep    6 minutes  1, seat position at 11 Y   Seated LAQ   X10 reps   Y   Seated knee flexion AROM/PROM  Supine Knee Flexion AROM/PROM   X10 reps each   X5 reps each-5 second hold  Y   Supine SAQ   2X10   Y                                   Assessment: Continues to have a slowed antalgic gait pattern with use of 2ww with increased pain. Continues to have significant difficulties with sit<>stand transfers from standard height surfaces with very guarded and stiff movement. Required extensive assistance with bed mobility for getting on/off mat table due to difficulties lifting legs. She has very low tolerance to exercises today with pain, states she did not take any pain meds prior to therapy session. Educated on taking some medication prior to therapy to assist with pain. Has made some gains in range of motion compared to evaluation.     Short Term Goals: 4 Weeks               -Increase bilateral knee AROM/PROM to 90 degrees or greater.                 -Strength of bilateral LE WFL for supine SLR without extension lag.                -LE strength WFL for static stance with symmetric LE wb without pain.  -LE strength WFL for sit to stand with minimal increase in pain.                -Intermittent vs. constant pain. Worst SPS rating less than [6/10].               -Patient will be independent in HEP               -Improve TUG with walker support to 45 seconds or less.                -Will be able to complete sit<>supine with SBA demonstrating improvements in strength.         Long Term Goals: 8 Weeks  -LE strength and ROM WFL to allow for normal body mechanics with mobility.  -LE strength WFL for symmetric LE WB during sit  to stand without UE assist.               -Resume community ambulation with use of a cane with normalized gait mechanics.                -Sleep not disrupted by LE pain. Worst SPS rating less than 3/10.  -LE strength / ROM WFL for patient to negotiate steps reciprocally for community mobility.                -Report improved functional activities at home with PSFS from 1/10 to 7/10 or greater.     Plan: Continue to progress with ROM of bilateral knees     Total Session Time 50, Timed code minutes 40, and Untimed code minutes 10 Ice   THERAPEUTIC EXERCISE 40 minutes      [employer], PT  03/20/2022, 11:45

## 2022-03-22 ENCOUNTER — Other Ambulatory Visit: Payer: Self-pay

## 2022-03-22 ENCOUNTER — Ambulatory Visit (HOSPITAL_COMMUNITY)
Admission: RE | Admit: 2022-03-22 | Discharge: 2022-03-22 | Disposition: A | Discharge status: Still In Hospital | Payer: MEDICAID | Source: Ambulatory Visit

## 2022-03-22 NOTE — PT Treatment (Signed)
[facility]  Outpatient Physical Therapy  [address]  [place], [address]  [phone]  (Fax) [phone]    Physical Therapy Treatment Note    Date: 03/22/2022  Patient's Name: Monique Kline  Date of Birth: 18-Feb-1966    Visit #/POC: 3/ up to 24  Authorization:  POC Signed?: No  POC Ends: 9/28  Next Progress Note Due: 04/07/22    Evaluating Physical Therapist: Louretta Parma, DPT  PT diagnosis/Reason for Referral: Bilateral TKA   Next Scheduled Physician Appointment: 03/24/22  Allergies/Contraindications: None       Subjective: Patient arrived x 9 minutes late. She states knees are some better today. Rates pain 7/10 at present. She did take pain medication prior to coming to PT.    Objective:       EXERCISE/ACTIVITY NAME REPETITIONS RESISTANCE COMPLETED THIS DOS   Nustep    7 minutes  1, starting seat position 13, moved up to 10 after 4 minutes Y   Seated LAQ   X10 reps   Y   Seated knee flexion AROM/PROM  Supine Knee Flexion AROM/PROM   X10 reps each   X5 reps each-5 second hold  N   Supine SAQ   2X10   N   SKTC with swiss ball   Y                             Assessment: Patient ambulates with very stiff legs with minimal knee flexion. Transitions are very pain guarded and slow. Verbal cues provided to increase knee flexion with gait and to focus on controlling descent with sitting down. By end of session, patient was demonstrating more knee flexion with ambulation and improved awareness of gait pattern.     Short Term Goals: 4 Weeks               -Increase bilateral knee AROM/PROM to 90 degrees or greater.                -Strength of bilateral LE WFL for supine SLR without extension lag.                -LE strength WFL for static stance with symmetric LE wb without pain.  -LE strength WFL for sit to stand with minimal increase in pain.                -Intermittent vs. constant pain. Worst SPS rating less than [6/10].               -Patient will be independent in HEP                -Improve TUG with walker support to 45 seconds or less.                -Will be able to complete sit<>supine with SBA demonstrating improvements in strength.         Long Term Goals: 8 Weeks  -LE strength and ROM WFL to allow for normal body mechanics with mobility.  -LE strength WFL for symmetric LE WB during sit to stand without UE assist.               -Resume community ambulation with use of a cane with normalized gait mechanics.                -Sleep not disrupted by LE pain. Worst SPS rating less than 3/10.  -LE strength /  ROM WFL for patient to negotiate steps reciprocally for community mobility.                -Report improved functional activities at home with PSFS from 1/10 to 7/10 or greater.     Plan: Continue to progress with ROM of bilateral knees     Total Session Time 30, Timed code minutes 30, and Untimed code minutes 0  THERAPEUTIC EXERCISE 30 minutes    Rashiya Lofland, PTA 03/22/2022 12:02

## 2022-03-24 ENCOUNTER — Other Ambulatory Visit: Payer: Self-pay

## 2022-03-24 ENCOUNTER — Ambulatory Visit (HOSPITAL_COMMUNITY)
Admission: RE | Admit: 2022-03-24 | Discharge: 2022-03-24 | Disposition: A | Discharge status: Still In Hospital | Payer: MEDICAID | Source: Ambulatory Visit

## 2022-03-24 NOTE — PT Treatment (Signed)
[facility]  Outpatient Physical Therapy  [address]  [place], [address]  [phone]  (Fax) [phone]    Physical Therapy Treatment Note    Date: 03/24/2022  Patient's Name: Monique Kline  Date of Birth: 1966/03/19      Visit #/POC: 4/ up to 24  Authorization:  POC Signed?: No  POC Ends: 9/28  Next Progress Note Due: 04/07/22     Evaluating Physical Therapist: Louretta Parma, DPT  PT diagnosis/Reason for Referral: Bilateral TKA   Next Scheduled Physician Appointment: 03/24/22  Allergies/Contraindications: None         Subjective: Arrived 10 minutes late today, she states that her father past away on Wednesday. She is feeling much better about her knees with improving movement and ease of range.      Objective:         EXERCISE/ACTIVITY NAME REPETITIONS RESISTANCE COMPLETED THIS DOS   Nustep     6 minutes  1, starting seat position 10 Y   Seated LAQ    3X10 reps bilaterally    Y   Seated knee flexion AROM/PROM  Supine Knee Flexion AROM/PROM    3X10 reps each   X5 reps each-5 second hold   Y   Supine SAQ    2X10    N   SKTC with swiss ball     N    Hip Adduction ball squeeze  2x10    Y                                          Assessment: [name] is progressing well with range of motion and activity tolerance compared to a week ago. Her edema has reduced significantly and is now able to get about 70 degrees of knee flexion passively on the left and 65 degrees on the right. She continues to present with moderate extensor lag with LAQ. She is also demonstrating improvement in normalizing gait mechanics with increased heel strike and increased flexion during swing phase.      Short Term Goals: 4 Weeks               -Increase bilateral knee AROM/PROM to 90 degrees or greater.                -Strength of bilateral LE WFL for supine SLR without extension lag.                -LE strength WFL for static stance with symmetric LE wb without pain.  -LE strength WFL for sit to stand with  minimal increase in pain.                -Intermittent vs. constant pain. Worst SPS rating less than [6/10].               -Patient will be independent in HEP               -Improve TUG with walker support to 45 seconds or less.                -Will be able to complete sit<>supine with SBA demonstrating improvements in strength.         Long Term Goals: 8 Weeks  -LE strength and ROM WFL to allow for normal body mechanics with mobility.  -LE strength WFL for symmetric LE WB during sit to stand  without UE assist.               -Resume community ambulation with use of a cane with normalized gait mechanics.                -Sleep not disrupted by LE pain. Worst SPS rating less than 3/10.  -LE strength / ROM WFL for patient to negotiate steps reciprocally for community mobility.                -Report improved functional activities at home with PSFS from 1/10 to 7/10 or greater.     Plan: Continue with bilateral knee ROM and quad strengthening     Total Session Time 35 and Timed code minutes 35  THERAPEUTIC EXERCISE 35 minutes      [employer], PT  03/24/2022, 12:04

## 2022-03-27 ENCOUNTER — Other Ambulatory Visit: Payer: Self-pay

## 2022-03-27 ENCOUNTER — Ambulatory Visit (HOSPITAL_COMMUNITY)
Admission: RE | Admit: 2022-03-27 | Discharge: 2022-03-27 | Disposition: A | Discharge status: Still In Hospital | Payer: MEDICAID | Source: Ambulatory Visit

## 2022-03-27 NOTE — PT Treatment (Signed)
[facility] Medicine [facility]  Outpatient Physical Therapy  [address]  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 States that her father's funeral is this Wednesday and will need to reschedule appointment for that date.      Objective:    Left knee extension: -6 degrees extensor lag with SLR  Right knee extension: -10 degrees extensor lag with SLR      EXERCISE/ACTIVITY NAME REPETITIONS RESISTANCE COMPLETED THIS DOS   Nustep     6 minutes  1, starting seat position 10 Y   Seated LAQ    3X10 reps bilaterally    Y   Seated knee flexion AROM/PROM  Supine Knee Flexion AROM/PROM    3X10 reps each   X5 reps each-5 second hold   Y   Supine SAQ    2X10    N   SKTC with swiss ball  2X10   Y    Hip Adduction ball squeeze  2x10    Y    Forward lunge on step   x5 holding 10 seconds    Y    SLR   x10 reps bilaterally     Y    Scar tissue massage      Y                Assessment: Patient continues to demonstrate great progression with exercises, increasing functional range of motion tolerance and reports continued ease of activities including getting in/out of car and walking. She still relies heavily on walker at this time. Educated on scar tissue massage at home to reduce hardening of the healing of the scar for optimal motion of the knee.      Short Term Goals: 4 Weeks               -Increase bilateral knee AROM/PROM to 90 degrees or greater.                -Strength of bilateral LE WFL for supine SLR  without extension lag.                -LE strength WFL for static stance with symmetric LE wb without pain.  -LE strength WFL for sit to stand with minimal increase in pain.                -Intermittent vs. constant pain. Worst SPS rating less than [6/10].               -Patient will be independent in HEP               -Improve TUG with walker support to 45 seconds or less.                -Will be able to complete sit<>supine with SBA demonstrating improvements in strength.         Long Term Goals: 8 Weeks  -LE strength and ROM WFL to allow  for normal body mechanics with mobility.  -LE strength WFL for symmetric LE WB during sit to stand without UE assist.               -Resume community ambulation with use of a cane with normalized gait mechanics.                -Sleep not disrupted by LE pain. Worst SPS rating less than 3/10.  -LE strength / ROM WFL for patient to negotiate steps reciprocally for community mobility.                -Report improved functional activities at home with PSFS from 1/10 to 7/10 or greater.      Plan: Continue with bilateral knee ROM and quad strengthening        Total Session Time 45 and Timed code minutes 45  THERAPEUTIC EXERCISE 45 minutes      [employer], PT  03/27/2022, 11:50

## 2022-03-29 ENCOUNTER — Ambulatory Visit (HOSPITAL_COMMUNITY): Payer: Self-pay

## 2022-03-31 ENCOUNTER — Other Ambulatory Visit: Payer: Self-pay

## 2022-03-31 ENCOUNTER — Ambulatory Visit (HOSPITAL_COMMUNITY)
Admission: RE | Admit: 2022-03-31 | Discharge: 2022-03-31 | Disposition: A | Discharge status: Still In Hospital | Payer: MEDICAID | Source: Ambulatory Visit

## 2022-03-31 NOTE — PT Treatment (Signed)
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 She is having quite a bit of pain and stiffness. She rates pain 7/10 today. She has taken pain medication today.      Objective:      EXERCISE/ACTIVITY NAME REPETITIONS RESISTANCE COMPLETED THIS DOS   Nustep     5 minutes  Level 2 Y   Seated LAQ    15 each    Y   Seated knee flexion AROM/PROM  Supine Knee Flexion AROM/PROM    3X10 reps each   X5 reps each-5 second hold   N   Supine SAQ    2X15   Y   SKTC with swiss ball 15 each   Y    Hip Adduction ball squeeze  2x10   N    Forward lunge on step   x5 holding 10 seconds   N    SLR   x10 reps bilaterally     Y    Scar tissue massage      N                Assessment: Patient demonstrates improving quad strength. Sit to stand transfers are looking better with improved use of lower extremities for standing up.      Short Term Goals: 4 Weeks               -Increase bilateral knee AROM/PROM to 90 degrees or greater.                -Strength of bilateral LE WFL for supine SLR without extension lag.                -LE strength WFL for static stance with symmetric LE wb without pain.  -LE strength WFL for sit to stand with minimal increase in pain.                -Intermittent vs. constant pain. Worst SPS rating less than [6/10].               -Patient will be independent in HEP               -Improve TUG with walker support to 45 seconds or less.                 -Will be able to complete sit<>supine with SBA demonstrating improvements in strength.         Long Term Goals: 8 Weeks  -LE strength and ROM WFL to allow for normal body mechanics with mobility.  -LE strength WFL for symmetric LE WB during sit to stand without UE assist.               -Resume community ambulation with use of a cane with normalized gait mechanics.                -  Sleep not disrupted by LE pain. Worst SPS rating less than 3/10.  -LE strength / ROM WFL for patient to negotiate steps reciprocally for community mobility.                -Report improved functional activities at home with PSFS from 1/10 to 7/10 or greater.      Plan: Continue per POC.        Total Session Time 40 and Timed code minutes 40  THERAPEUTIC EXERCISE 40 minutes      Manessa Buley, PTA  03/31/2022 11:50

## 2022-04-03 ENCOUNTER — Other Ambulatory Visit: Payer: Self-pay

## 2022-04-03 ENCOUNTER — Ambulatory Visit (HOSPITAL_COMMUNITY)
Admission: RE | Admit: 2022-04-03 | Discharge: 2022-04-03 | Disposition: A | Discharge status: Still In Hospital | Payer: MEDICAID | Source: Ambulatory Visit

## 2022-04-03 NOTE — PT Treatment (Signed)
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 She was able to stay the whole night but did not sleep well. She was quite uncomfortable trying to be in bed. She rates pain today 5/10.      Objective:      EXERCISE/ACTIVITY NAME REPETITIONS RESISTANCE COMPLETED THIS DOS   Nustep     6 minutes  Level 4 Y   Seated LAQ    15 each    Y   Seated knee flexion AROM/PROM  Supine Knee Flexion AROM/PROM    3X10 reps each   X5 reps each-5 second hold   N   Supine SAQ    2X15   Y   SKTC with swiss ball 20 each   Y    Hip Adduction ball squeeze  2x10   N    Forward lunge on step   x5 holding 10 seconds   N    SLR   x10 reps bilaterally     N    Scar tissue massage      N    Stand knee flexion stretch on step 5 x 10 each   Y       Assessment: Patient tolerated session very well today. She demonstrates improved ease of transfer onto and off of table and NuStep. Gait pattern is improving however still limited with knee flexion.     Short Term Goals: 4 Weeks               -Increase bilateral knee AROM/PROM to 90 degrees or greater.                -Strength of bilateral LE WFL for supine SLR without extension lag.                -LE strength WFL for static stance with symmetric LE wb without pain.  -LE strength WFL for sit to stand with minimal increase in pain.                -Intermittent vs. constant pain. Worst SPS rating less than [6/10].               -Patient  will be independent in HEP               -Improve TUG with walker support to 45 seconds or less.                -Will be able to complete sit<>supine with SBA demonstrating improvements in strength.         Long Term Goals: 8 Weeks  -LE strength and ROM WFL to allow for normal body mechanics with mobility.  -LE strength WFL for symmetric LE WB during sit to stand without UE assist.               -Resume community ambulation with use of a  cane with normalized gait mechanics.                -Sleep not disrupted by LE pain. Worst SPS rating less than 3/10.  -LE strength / ROM WFL for patient to negotiate steps reciprocally for community mobility.                -Report improved functional activities at home with PSFS from 1/10 to 7/10 or greater.      Plan: Continue per POC.        Total Session Time 40 and Timed code minutes 40  THERAPEUTIC EXERCISE 40 minutes      Monique Kline, PTA  04/03/2022 11:50

## 2022-04-05 ENCOUNTER — Ambulatory Visit (HOSPITAL_COMMUNITY)
Admission: RE | Admit: 2022-04-05 | Discharge: 2022-04-05 | Disposition: A | Discharge status: Still In Hospital | Payer: MEDICAID | Source: Ambulatory Visit

## 2022-04-05 ENCOUNTER — Other Ambulatory Visit: Payer: Self-pay

## 2022-04-05 NOTE — PT Treatment (Signed)
[facility] Medicine [facility]  Outpatient Physical Therapy  [address]  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 She relates pain is not that band but very stiff. She rates pain 4/10 at present. She has been working with home exercises, trying to get more flexion.  Objective:      EXERCISE/ACTIVITY NAME REPETITIONS RESISTANCE COMPLETED THIS DOS   Nustep     6 minutes  Level 4 Y   Seated LAQ    15 each    Y   Seated knee flexion AROM/PROM  Supine Knee Flexion AROM/PROM    3X10 reps each   X5 reps each-5 second hold   N   Supine SAQ    2X15   Y   SKTC with swiss ball 20 each   Y    Hip Adduction ball squeeze  2x10   N    Forward lunge on step   x5 holding 10 seconds   N    SLR   x10 reps bilaterally    N    Scar tissue massage     Y    Stand knee flexion stretch on step 5 x 10 each   N   PROM flexion/extension seated   Y    Gait training  2 laps around clinic  Y           AROM left -2 to 90 seated  AROM right -2 to 80 seated     Assessment: Patient ambulated into clinic with much better knee bend on left compared to previous sessions. With passive ROM, right knee was stiffer with flexion vs left. Gait training focused on improving gait pattern to increase knee flexion. By end of second lap patient was ambulating with much smoother gait pattern.     Short Term Goals: 4 Weeks               -Increase bilateral knee AROM/PROM to 90 degrees or greater.                -Strength of bilateral LE WFL for  supine SLR without extension lag.                -LE strength WFL for static stance with symmetric LE wb without pain.  -LE strength WFL for sit to stand with minimal increase in pain.                -Intermittent vs. constant pain. Worst SPS rating less than [6/10].               -Patient will be independent in HEP               -Improve TUG with walker support to 45 seconds or less.                -Will be able to complete sit<>supine with SBA demonstrating improvements in strength.  Long Term Goals: 8 Weeks  -LE strength and ROM WFL to allow for normal body mechanics with mobility.  -LE strength WFL for symmetric LE WB during sit to stand without UE assist.               -Resume community ambulation with use of a cane with normalized gait mechanics.                -Sleep not disrupted by LE pain. Worst SPS rating less than 3/10.  -LE strength / ROM WFL for patient to negotiate steps reciprocally for community mobility.                -Report improved functional activities at home with PSFS from 1/10 to 7/10 or greater.      Plan: Continue per POC.        Total Session Time 40 and Timed code minutes 40  THERAPEUTIC EXERCISE 40 minutes      [employer], PTA  04/05/2022 11:48

## 2022-04-07 ENCOUNTER — Other Ambulatory Visit: Payer: Self-pay

## 2022-04-07 ENCOUNTER — Ambulatory Visit (HOSPITAL_COMMUNITY)
Admission: RE | Admit: 2022-04-07 | Discharge: 2022-04-07 | Disposition: A | Discharge status: Still In Hospital | Payer: MEDICAID | Source: Ambulatory Visit

## 2022-04-07 NOTE — PT Treatment (Signed)
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 Also reports she is finding it easier to complete walking with 4ww and transfers.     Objective:      Patient-Specific Functional Score:     Problem Score 04/07/22   1. Getting in/out of bed  0 8   2. Getting in/out of car 0 10   3. Walking community  3 6   Total 1 8     Timed up and Go: 25 seconds with 4ww ( Compared to 1 minute 45 seconds with 2ww at evaluation)  30 seconds with cane     Left Knee PROM -2 to 98 degrees (AROM 90 degrees flexion)  Right Knee PROM -5 to 85 degrees (AROM 80 degrees flexion)    EXERCISE/ACTIVITY NAME REPETITIONS RESISTANCE COMPLETED THIS DOS   Nustep     8 minutes  Level 4 Y   Seated LAQ    15 each    Y   Seated knee flexion AROM/PROM  Supine Knee Flexion AROM/PROM    3X10 reps each   X5 reps each-5 second hold   N   Supine SAQ    2X15   N   SKTC with swiss ball 20 each   Y    Hip Adduction ball squeeze  2x10   N    Forward lunge on step   x5 holding 10 seconds   y    SLR   x10 reps bilaterally    N    Scar tissue massage     n    Stand knee flexion stretch on step 5 x 10 each   y   PROM flexion/extension seated     Y    Gait training  1 laps around clinic  With cane Y     Step ups   x10 each leg  4 inch   Y        Assessment: As noted above in objective findings and with the goal status, patient is progressing well with her  goals. She does continue to have limited AROM and PROM of bilateral knees with right more deficit than left. She also reports she had an episode of knees buckling as she fatigues with walking. Her pain is much more controlled compared to the first few sessions. She is also progressed to 4ww with improved ease, started cane training this date. Recommending continued use of walker until we can improve gait mechanics and stability with cane.  Will continue to benefit from skilled PT services to continue progressing with goals.      Short Term Goals: 4 Weeks               -Increase bilateral knee AROM/PROM to 90 degrees or greater. PROGRESSING ON RIGHT, MET ON LEFT 8/25               -  Strength of bilateral LE WFL for supine SLR without extension lag. PROGRESSING 8/25               -LE strength WFL for static stance with symmetric LE wb without pain. PROGRESSING   -LE strength WFL for sit to stand with minimal increase in pain. MET 8/25               -Intermittent vs. constant pain. Worst SPS rating less than [6/10]. MET 8/25               -Patient will be independent in HEP MET 8/25               -Improve TUG with walker support to 45 seconds or less. MET 8/25               -Will be able to complete sit<>supine with SBA demonstrating improvements in strength. MET 8/25        Long Term Goals: 8 Weeks  -LE strength and ROM WFL to allow for normal body mechanics with mobility. PROGRESSING   -LE strength WFL for symmetric LE WB during sit to stand without UE assist. PROGRESSING                -Resume community ambulation with use of a cane with normalized gait mechanics. PROGRESSING                -Sleep not disrupted by LE pain. Worst SPS rating less than 3/10. PROGRESSING 8/25  -LE strength / ROM WFL for patient to negotiate steps reciprocally for community mobility. PROGRESSING                -Report improved functional activities at home with PSFS from 1/10 to 7/10 or greater. MET 8/25     Plan: Continue per POC.    Total Session Time 40 and Timed code minutes 40  THERAPEUTIC EXERCISE 40 minutes      William Hamburger, PT  04/07/2022, 11:49

## 2022-04-10 ENCOUNTER — Ambulatory Visit (HOSPITAL_COMMUNITY)
Admission: RE | Admit: 2022-04-10 | Discharge: 2022-04-10 | Disposition: A | Discharge status: Still In Hospital | Payer: MEDICAID | Source: Ambulatory Visit

## 2022-04-10 ENCOUNTER — Other Ambulatory Visit: Payer: Self-pay

## 2022-04-10 NOTE — PT Treatment (Signed)
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 She cannot get comfortable at night. She rates "discomfort" 4/10 this date.     Objective:      EXERCISE/ACTIVITY NAME REPETITIONS RESISTANCE COMPLETED THIS DOS   Nustep     6 minutes  Level 1 Y   Seated LAQ    15 each   2 sets  2# Y   Seated knee flexion AROM/PROM  Supine Knee Flexion AROM/PROM    3X10 reps each   X5 reps each-5 second hold   N   Supine SAQ    2X15  2# Y   SKTC with swiss ball 20 each   Y    Hip Adduction ball squeeze  2x10   N    Forward lunge on step   x5 holding 10 seconds   y    SLR   x10 reps bilaterally    N    Scar tissue massage     n    Stand knee flexion stretch on step 3 x 15 each   y   PROM flexion/extension seated     Y    Gait training  2 laps around clinic  With cane Y     Step ups   x10 each leg  4 inch  Y        Assessment: Patient tolerated session well. Added 2# cuff weights to LAQ and SAQ today. Patient's gait improved with SC after one lap around clinic.       Short Term Goals: 4 Weeks               -Increase bilateral knee AROM/PROM to 90 degrees or greater. PROGRESSING ON RIGHT, MET ON LEFT 8/25               -Strength of bilateral LE WFL for supine SLR without extension lag. PROGRESSING 8/25               -LE strength WFL for static stance with symmetric LE wb without pain. PROGRESSING   -LE strength WFL for sit to stand with minimal increase in  pain. MET 8/25               -Intermittent vs. constant pain. Worst SPS rating less than [6/10]. MET 8/25               -Patient will be independent in HEP MET 8/25               -Improve TUG with walker support to 45 seconds or less. MET 8/25               -Will be able to complete sit<>supine with SBA demonstrating improvements in strength. MET 8/25        Long Term Goals: 8 Weeks  -LE strength and ROM WFL to allow  for normal body mechanics with mobility. PROGRESSING   -LE strength WFL for symmetric LE WB during sit to stand without UE assist. PROGRESSING                -Resume community ambulation with use of a cane with normalized gait mechanics. PROGRESSING                -Sleep not disrupted by LE pain. Worst SPS rating less than 3/10. PROGRESSING 8/25  -LE strength / ROM WFL for patient to negotiate steps reciprocally for community mobility. PROGRESSING                -Report improved functional activities at home with PSFS from 1/10 to 7/10 or greater. MET 8/25     Plan: Continue per POC.     Total Session Time 40 and Timed code minutes 40  THERAPEUTIC EXERCISE 40 minutes    Ashleigh Pruett, PTA 04/10/2022 11:50

## 2022-04-12 ENCOUNTER — Other Ambulatory Visit: Payer: Self-pay

## 2022-04-12 ENCOUNTER — Ambulatory Visit (HOSPITAL_COMMUNITY)
Admission: RE | Admit: 2022-04-12 | Discharge: 2022-04-12 | Disposition: A | Discharge status: Still In Hospital | Payer: MEDICAID | Source: Ambulatory Visit

## 2022-04-12 NOTE — PT Treatment (Addendum)
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 Minimal pain in knees today.      Objective:     Right knee flexion approximately 80 degrees passively   Left knee flexion approximately 100 degrees passively   Continues to present with raises scar tissue on right compared to left.      EXERCISE/ACTIVITY NAME REPETITIONS RESISTANCE COMPLETED THIS DOS   Nustep     6 minutes  Level 1 Y   Seated LAQ    15 each   2 sets  2# Y   Seated knee flexion AROM/PROM  Supine Knee Flexion AROM/PROM    3X10 reps each   X5 reps each-5 second hold  rtb seated  Y   Supine SAQ    2X15  2# N   SKTC with swiss ball 20 each   N    Hip Adduction ball squeeze  2x10   N    Forward lunge on step   x5 holding 10 seconds   y    SLR   x10 reps bilaterally    N    Scar tissue massage     Y    Stand knee flexion stretch on step 3 x 15 each   y   PROM flexion/extension seated     Y    Gait training  2 laps around clinic  With cane Y     Step ups   x10 each leg  4 inch  Y   Prone knee bends with strap  X20 each holding 5-10 seconds   Y      Assessment: Patient is tolerating progression of quad strengthening exercises with ongoing extensor lag approximately 5 degrees. She does have ongoing limitations in knee flexion with right with greater limitations compared to left. Added prone knee bends with strap to facilitate increasing knee flexion ROM bilaterally.  Continued to progress with gait training using cane as she is improving with sequencing and heel strike. Continues to be limited in knee range of motion during swing phase. Educated that she can start to use the cane around the house and continue to use 4ww for community ambulation.         Short Term Goals: 4 Weeks               -Increase bilateral knee AROM/PROM to 90 degrees or greater. PROGRESSING ON RIGHT, MET ON LEFT 8/25               -Strength of bilateral LE WFL for supine SLR without extension lag. PROGRESSING 8/25               -LE strength WFL for static stance with symmetric LE wb without pain. PROGRESSING   -LE strength WFL for sit to stand with minimal increase in pain. MET 8/25               -  Intermittent vs. constant pain. Worst SPS rating less than [6/10]. MET 8/25               -Patient will be independent in HEP MET 8/25               -Improve TUG with walker support to 45 seconds or less. MET 8/25               -Will be able to complete sit<>supine with SBA demonstrating improvements in strength. MET 8/25        Long Term Goals: 8 Weeks  -LE strength and ROM WFL to allow for normal body mechanics with mobility. PROGRESSING   -LE strength WFL for symmetric LE WB during sit to stand without UE assist. PROGRESSING                -Resume community ambulation with use of a cane with normalized gait mechanics. PROGRESSING                -Sleep not disrupted by LE pain. Worst SPS rating less than 3/10. PROGRESSING 8/25  -LE strength / ROM WFL for patient to negotiate steps reciprocally for community mobility. PROGRESSING                -Report improved functional activities at home with PSFS from 1/10 to 7/10 or greater. MET 8/25     Plan: Continue per POC.     Total Session Time 45 and Timed code minutes 45  THERAPEUTIC EXERCISE 35 minutes and GAIT TRAINING [address] PT  04/12/2022, 11:49

## 2022-04-14 ENCOUNTER — Other Ambulatory Visit: Payer: Self-pay

## 2022-04-14 ENCOUNTER — Ambulatory Visit
Admission: RE | Admit: 2022-04-14 | Discharge: 2022-04-14 | Disposition: A | Discharge status: Still In Hospital | Payer: MEDICAID | Source: Ambulatory Visit | Attending: Orthopaedic Surgery | Admitting: Orthopaedic Surgery

## 2022-04-14 NOTE — PT Treatment (Signed)
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 States that she has been using cane more around the house. Does ambulate into clinic with 4ww.      Objective:   Initiated session on Nustep with focus on improving knee flexion and extension. Followed up with there ex in table below.   Right knee flexion approximately 84 degrees passively   Left knee flexion approximately 95 degrees passively   Continues to present with raises scar tissue on right compared to left.      EXERCISE/ACTIVITY NAME REPETITIONS RESISTANCE COMPLETED THIS DOS   Nustep     X2 minutes seat 10  4 minutes seat 8  Level 1 Y   Seated LAQ    15 each   2 sets  2# Y   Seated knee flexion AROM/PROM  Supine Knee Flexion AROM/PROM    3X10 reps each   X5 reps each-5 second hold  Y   Supine SAQ    2X15  2# N   DKTC with swiss ball 20 each   Y    Hip Adduction ball squeeze  2x10   y    Forward lunge on step   x5 holding 10 seconds   y    SLR   x20 reps bilaterally    y    Scar tissue massage     n    Stand knee flexion stretch on step 3 x 15 each   y   PROM flexion/extension seated     Y    Gait training  2 laps around clinic  With cane Y     Step ups   x10 each leg  4 inch  Y   Prone knee bends with strap  X15 each holding 5-10 seconds    Y   Standing TKE X10 eacH RTB Y      Assessment: Patient does present with  extensor lag of approximately 15 degrees with SLR on right and approximately 12 degrees on left. Continues to have limited motion more on right than left by about 10 degrees with flexion. Was able to progress with standing TKE and increased reps of SLR with a moderate challenge. Providing tactile and verbal cues for slow controlled motions for increased muscle recruitment. Educated on continued ice and elevation.         Short Term Goals: 4 Weeks               -Increase bilateral knee AROM/PROM to 90 degrees or greater. PROGRESSING ON RIGHT, MET ON LEFT 8/25               -Strength of bilateral LE WFL for supine SLR without extension lag. PROGRESSING 8/25               -LE  strength WFL for static stance with symmetric LE wb without pain. PROGRESSING   -LE strength WFL for sit to stand with minimal increase in pain. MET 8/25               -Intermittent vs. constant pain. Worst SPS rating less than [6/10]. MET 8/25               -Patient will be independent in HEP MET 8/25               -Improve TUG with walker support to 45 seconds or less. MET 8/25               -Will be able to complete sit<>supine with SBA demonstrating improvements in strength. MET 8/25        Long Term Goals: 8 Weeks  -LE strength and ROM WFL to allow for normal body mechanics with mobility. PROGRESSING   -LE strength WFL for symmetric LE WB during sit to stand without UE assist. PROGRESSING                -Resume community ambulation with use of a cane with normalized gait mechanics. PROGRESSING                -Sleep not disrupted by LE pain. Worst SPS rating less than 3/10. PROGRESSING 8/25  -LE strength / ROM WFL for patient to negotiate steps reciprocally for community mobility. PROGRESSING                -Report improved functional activities at home with PSFS from 1/10 to 7/10 or greater. MET 8/25     Plan: Continue per POC.     Total Session Time 40 and Timed code minutes 40  THERAPEUTIC EXERCISE 40 minutes      William Hamburger, PT   04/14/2022, 11:53

## 2022-04-18 ENCOUNTER — Other Ambulatory Visit: Payer: Self-pay

## 2022-04-18 ENCOUNTER — Ambulatory Visit (HOSPITAL_COMMUNITY)
Admission: RE | Admit: 2022-04-18 | Discharge: 2022-04-18 | Disposition: A | Discharge status: Still In Hospital | Payer: MEDICAID | Source: Ambulatory Visit

## 2022-04-18 DIAGNOSIS — Z96653 Presence of artificial knee joint, bilateral: Secondary | ICD-10-CM | POA: Insufficient documentation

## 2022-04-18 NOTE — PT Treatment (Signed)
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 States that she has been using cane more around the house. Does ambulate into clinic with 4ww.      Objective:   Initiated session on Nustep with focus on improving knee flexion and extension. Followed up with there ex in table below.   Right knee flexion approximately 84 degrees passively   Left knee flexion approximately 95 degrees passively   Continues to present with raises scar tissue on right compared to left.      EXERCISE/ACTIVITY NAME REPETITIONS RESISTANCE COMPLETED THIS DOS   Nustep     X2 minutes seat 10  4 minutes seat 8  Level 1 Y   Seated LAQ    15 each   2 sets  2# Y   Seated knee flexion AROM/PROM  Supine Knee Flexion AROM/PROM    3X10 reps each   X5 reps each-5 second hold   Y   Supine SAQ    2X15  2# N   DKTC with swiss ball 20 each   Y    Hip Adduction ball squeeze  2x10   y    Forward lunge on step   x5 holding 10 seconds   y    SLR   x20 reps bilaterally    y    Scar tissue massage     n    Stand knee flexion stretch on step 3 x 15 each   y   PROM flexion/extension seated     Y    Gait training  2 laps around clinic  With cane Y     Step ups   x10 each leg  4 inch  Y   Prone knee bends with strap  X15 each holding 5-10 seconds    Y   Standing TKE X10 eacH RTB Y      Assessment: Patient does present  with extensor lag of approximately 15 degrees with SLR on right and approximately 12 degrees on left. Continues to have limited motion more on right than left by about 10 degrees with flexion. Was able to progress with standing TKE and increased reps of SLR with a moderate challenge. Providing tactile and verbal cues for slow controlled motions for increased muscle recruitment. Educated on continued ice and elevation.         Short Term Goals: 4 Weeks               -Increase bilateral knee AROM/PROM to 90 degrees or greater. PROGRESSING ON RIGHT, MET ON LEFT 8/25               -Strength of bilateral LE WFL for supine SLR without extension lag. PROGRESSING 8/25               -  LE strength WFL for static stance with symmetric LE wb without pain. PROGRESSING   -LE strength WFL for sit to stand with minimal increase in pain. MET 8/25               -Intermittent vs. constant pain. Worst SPS rating less than [6/10]. MET 8/25               -Patient will be independent in HEP MET 8/25               -Improve TUG with walker support to 45 seconds or less. MET 8/25               -Will be able to complete sit<>supine with SBA demonstrating improvements in strength. MET 8/25        Long Term Goals: 8 Weeks  -LE strength and ROM WFL to allow for normal body mechanics with mobility. PROGRESSING   -LE strength WFL for symmetric LE WB during sit to stand without UE assist. PROGRESSING                -Resume community ambulation with use of a cane with normalized gait mechanics. PROGRESSING                -Sleep not disrupted by LE pain. Worst SPS rating less than 3/10. PROGRESSING 8/25  -LE strength / ROM WFL for patient to negotiate steps reciprocally for community mobility. PROGRESSING                -Report improved functional activities at home with PSFS from 1/10 to 7/10 or greater. MET 8/25     Plan: Continue per POC.     {PRN Timed/Untimed billable minutes:43252}  {INTERVENTION MINUTES:42227}      Elinor Parkinson, PTA  04/18/2022,  11:06

## 2022-04-20 ENCOUNTER — Ambulatory Visit (HOSPITAL_COMMUNITY)
Admission: RE | Admit: 2022-04-20 | Discharge: 2022-04-20 | Disposition: A | Discharge status: Still In Hospital | Payer: MEDICAID | Source: Ambulatory Visit

## 2022-04-20 ENCOUNTER — Other Ambulatory Visit: Payer: Self-pay

## 2022-04-20 NOTE — PT Treatment (Signed)
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      Objective: Warm up on Nustep followed by exercises per flowsheet     EXERCISE/ACTIVITY NAME REPETITIONS RESISTANCE COMPLETED THIS DOS   Nustep     X6 minutes seat 8 Level 1 Y   Seated LAQ    15 each   2 sets  2# n   Seated knee flexion AROM/PROM  Supine Knee Flexion AROM/PROM    3X10 reps each   X5 reps each-5 second hold   Y   Supine SAQ    2X15  2# N   DKTC with swiss ball 20 each   Y    Hip Adduction ball squeeze  2x10   y    Forward lunge on step   x5 holding 10 seconds   y    SLR   x20 reps bilaterally    y    Scar tissue massage     n    Stand knee flexion stretch on step 3 x 15 each   y   PROM flexion/extension seated     Y    Gait training  2 laps around clinic  With cane n    Step ups   x10 each leg  4 inch  Y   Prone knee bends with strap  X15 each holding 5-10 seconds    Y   Standing TKE X10 eacH RTB n      Assessment: Patient continues to be most limited in right knee flexion to about 90 degrees passively and about 100 degrees passively with left knee. Therapist focused on contract/relax technique in seated and supine position to assist with motion, able to gain a couple degrees each, but no significant improvements, discussed focusing on knee flexion ROM exercises at home.      Short Term Goals: 4 Weeks               -Increase  bilateral knee AROM/PROM to 90 degrees or greater. PROGRESSING ON RIGHT, MET ON LEFT 8/25               -Strength of bilateral LE WFL for supine SLR without extension lag. PROGRESSING 8/25               -LE strength WFL for static stance with symmetric LE wb without pain. PROGRESSING   -LE strength WFL for sit to stand with minimal increase in pain. MET 8/25               -Intermittent vs. constant pain. Worst SPS rating less than [6/10]. MET 8/25               -Patient will be independent in HEP MET 8/25               -Improve  TUG with walker support to 45 seconds or less. MET 8/25               -Will be able to complete sit<>supine with SBA demonstrating improvements in strength. MET 8/25        Long Term Goals: 8 Weeks  -LE strength and ROM WFL to allow for normal body mechanics with mobility. PROGRESSING   -LE strength WFL for symmetric LE WB during sit to stand without UE assist. PROGRESSING                -Resume community ambulation with use of a cane with normalized gait mechanics. PROGRESSING                -Sleep not disrupted by LE pain. Worst SPS rating less than 3/10. PROGRESSING 8/25  -LE strength / ROM WFL for patient to negotiate steps reciprocally for community mobility. PROGRESSING                -Report improved functional activities at home with PSFS from 1/10 to 7/10 or greater. MET 8/25     Plan: Continue per POC.     Total Session Time 35, Timed code minutes 35, and Untimed code minutes 10 ice  THERAPEUTIC EXERCISE 35 minutes      [employer], PT  04/20/2022, 11:53

## 2022-04-24 ENCOUNTER — Other Ambulatory Visit: Payer: Self-pay

## 2022-04-24 ENCOUNTER — Ambulatory Visit (HOSPITAL_COMMUNITY)
Admission: RE | Admit: 2022-04-24 | Discharge: 2022-04-24 | Disposition: A | Discharge status: Still In Hospital | Payer: MEDICAID | Source: Ambulatory Visit

## 2022-04-24 NOTE — PT Treatment (Signed)
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 She is in no pain at rest however reports continued "tightness" bilateral knees. She was able to go to church for first time since surgery yesterday and did well.      Objective: Warm up on Nustep followed by exercises per flowsheet     EXERCISE/ACTIVITY NAME REPETITIONS RESISTANCE COMPLETED THIS DOS   Nustep     7 minutes Level 5 Y   Seated LAQ    15 each   2 sets  2# n   Seated knee flexion AROM/PROM  Supine Knee Flexion AROM/PROM    3X10 reps each   X5 reps each-5 second hold   Y   Supine SAQ    2X15  2# N   DKTC with swiss ball 20 each   n    Hip Adduction ball squeeze  2x10   n    Forward lunge on step   x5 holding 10 seconds   n    SLR   x20 reps bilaterally    n    Scar tissue massage     n    Stand knee flexion stretch on step 5 x 20 sec   y   PROM flexion/extension seated     Y    Gait training  2 laps around clinic  With cane n    Step ups   x15 each leg  6 inch  Y   Prone knee bends with strap  5 x 20 sec   Y   Standing TKE X10 eacH RTB n      Assessment: Patient demonstrates improving knee strength bilaterally. She continues with knee flexion limitations R>L.       Short Term Goals: 4 Weeks               -Increase bilateral knee AROM/PROM to 90 degrees or greater. PROGRESSING ON RIGHT, MET ON LEFT 8/25               -Strength of bilateral LE WFL for supine SLR without extension lag.  PROGRESSING 8/25               -LE strength WFL for static stance with symmetric LE wb without pain. PROGRESSING   -LE strength WFL for sit to stand with minimal increase in pain. MET 8/25               -Intermittent vs. constant pain. Worst SPS rating less than [6/10]. MET 8/25               -Patient will be independent in HEP MET 8/25               -Improve TUG with walker support to 45 seconds or less. MET 8/25               -  Will be able to complete sit<>supine with SBA demonstrating improvements in strength. MET 8/25        Long Term Goals: 8 Weeks  -LE strength and ROM WFL to allow for normal body mechanics with mobility. PROGRESSING   -LE strength WFL for symmetric LE WB during sit to stand without UE assist. PROGRESSING                -Resume community ambulation with use of a cane with normalized gait mechanics. PROGRESSING                -Sleep not disrupted by LE pain. Worst SPS rating less than 3/10. PROGRESSING 8/25  -LE strength / ROM WFL for patient to negotiate steps reciprocally for community mobility. PROGRESSING                -Report improved functional activities at home with PSFS from 1/10 to 7/10 or greater. MET 8/25     Plan: Continue per POC.     Total Session Time 37, Timed code minutes 37, and Untimed code minutes 0  THERAPEUTIC EXERCISE 37 minutes      Yani [employer], PTA  04/24/2022 12:07

## 2022-04-26 ENCOUNTER — Other Ambulatory Visit: Payer: Self-pay

## 2022-04-26 ENCOUNTER — Ambulatory Visit (HOSPITAL_COMMUNITY)
Admission: RE | Admit: 2022-04-26 | Discharge: 2022-04-26 | Disposition: A | Discharge status: Still In Hospital | Payer: MEDICAID | Source: Ambulatory Visit

## 2022-04-26 NOTE — PT Treatment (Signed)
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 She states that pain is minimal just stiffness.      Objective: Warm up on Nustep followed by exercises per flowsheet    Left Knee flexion on leg press: 110 degrees,   Right knee flexion on leg press: 97 degrees      EXERCISE/ACTIVITY NAME REPETITIONS RESISTANCE COMPLETED THIS DOS   Nustep     7 minutes Level 5 Y   Seated LAQ    15 each   2 sets  2# n   Seated knee flexion AROM/PROM  Supine Knee Flexion AROM/PROM    3X10 reps each   X5 reps each-5 second hold   Y   Supine SAQ    2X15  2# N   DKTC with swiss ball 20 each   n    Hip Adduction ball squeeze  2x10   n    Forward lunge on step   x5 holding 10 seconds   n    SLR   x20 reps bilaterally    n    Scar tissue massage     n    Stand knee flexion stretch on step 5 x 20 sec   n   PROM flexion/extension seated     Y    Gait training  2 laps around clinic  With cane n    Step ups   x15 each leg  6 inch  Y   Prone knee bends with strap  5 x 20 sec   Y   Standing TKE X10 eacH RTB n   Leg press focus on knee flexion  2x10 30# Y      Assessment: Patient was able to progress with increasing bilateral knee flexion this date with left achieving 110 degrees by end of session in supine and leg press and right knee flexion still limited to about 95-98  degrees. We focused solely on knee flexion activities today and will continue to focus primarily on knee flexion exercises the next few sessions.         Short Term Goals: 4 Weeks               -Increase bilateral knee AROM/PROM to 90 degrees or greater. PROGRESSING ON RIGHT, MET ON LEFT 8/25               -Strength of bilateral LE WFL for supine SLR without extension lag. PROGRESSING 8/25               -LE strength WFL for static stance with symmetric LE wb without pain. PROGRESSING   -LE strength WFL for sit to stand with minimal increase in pain. MET 8/25               -  Intermittent vs. constant pain. Worst SPS rating less than [6/10]. MET 8/25               -Patient will be independent in HEP MET 8/25               -Improve TUG with walker support to 45 seconds or less. MET 8/25               -Will be able to complete sit<>supine with SBA demonstrating improvements in strength. MET 8/25        Long Term Goals: 8 Weeks  -LE strength and ROM WFL to allow for normal body mechanics with mobility. PROGRESSING   -LE strength WFL for symmetric LE WB during sit to stand without UE assist. PROGRESSING                -Resume community ambulation with use of a cane with normalized gait mechanics. PROGRESSING                -Sleep not disrupted by LE pain. Worst SPS rating less than 3/10. PROGRESSING 8/25  -LE strength / ROM WFL for patient to negotiate steps reciprocally for community mobility. PROGRESSING                -Report improved functional activities at home with PSFS from 1/10 to 7/10 or greater. MET 8/25    Plan: Continue to progress with improving bilateral knee flexion as ortho wants aggressive PT for improving motion.     Total Session Time 45 and Timed code minutes 45  THERAPEUTIC EXERCISE 45 minutes      [employer], PT  04/26/2022, 12:00

## 2022-04-28 ENCOUNTER — Ambulatory Visit (HOSPITAL_COMMUNITY): Payer: Self-pay

## 2022-05-02 ENCOUNTER — Ambulatory Visit (HOSPITAL_COMMUNITY)
Admission: RE | Admit: 2022-05-02 | Discharge: 2022-05-02 | Disposition: A | Discharge status: Still In Hospital | Payer: MEDICAID | Source: Ambulatory Visit

## 2022-05-02 ENCOUNTER — Other Ambulatory Visit: Payer: Self-pay

## 2022-05-02 NOTE — PT Treatment (Signed)
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 She reports she continues to focus on increasing knee range of motion at home.      Objective: Warm up on Nustep followed by exercises per flowsheet      Patient-Specific Functional Score:     Problem Score 04/07/22 05/02/22   1. Getting in/out of bed  0 8 10   2. Getting in/out of car 0 10 10   3. Walking community  3 6 8    Total 1 8 9.3      Timed up and Go: 25 seconds with 4ww ( Compared to 1 minute 45 seconds with 2ww at evaluation)  30 seconds with cane      Left Knee flexion supine: 108 degrees  Left Knee extensor lag SLR: 6 degrees   Right knee flexion supine: 96 degrees   Right knee extensor lag SLR: 12 degrees      EXERCISE/ACTIVITY NAME REPETITIONS RESISTANCE COMPLETED THIS DOS   Nustep     10 minutes seat 8 then 7 for increased ROM Level 3 Y   Seated LAQ    15 each   2 sets  2# n   Seated knee flexion AROM/PROM  Supine Knee Flexion AROM/PROM    3X10 reps each   X5 reps each-5 second hold   Y   Supine SAQ    2X15  2# N   DKTC with swiss ball 20 each   n    Hip Adduction ball squeeze  2x10   n    Forward lunge on step   x5 holding 10 seconds   n    SLR   x20 reps bilaterally    Y    Scar tissue massage     n    Stand knee flexion stretch on step 5 x 20 sec   n   PROM flexion/extension seated     Y    Gait training  2  laps around clinic  With cane n    Step ups   x15 each leg  6 inch  n   Prone knee bends with strap  5 x 20 sec   n   Standing TKE X10 eacH RTB n   Leg press focus on knee flexion  2x10 30# n   Review of goals and assessment    Y      Assessment:  Patient continues to demonstrate limitations in right knee flexion compared to left. She has been able to show small gains over the last couple weeks in motion as noted above with right knee flexion up to 95-97 degrees and left knee flexion 105-108 degrees passively. She has had minimal pain overall until after last session, and now reports increased pain in bilateral knees. She has improved significant  with overall activity tolerance and gait mechanics with cane since start of evaluation. Will continue to benefit from skilled PT services to further progress with functional range as her ortho has ordered continued agressive PT focusing on range.        Short Term Goals: 4 Weeks               -Increase bilateral knee AROM/PROM to 90 degrees or greater. Nearly MET on right, MET on left                -Strength of bilateral LE WFL for supine SLR without extension lag. PROGRESSING                -LE strength WFL for static stance with symmetric LE wb without pain. Progressing   -LE strength WFL for sit to stand with minimal increase in pain. MET 8/25               -Intermittent vs. constant pain. Worst SPS rating less than [6/10]. MET 8/25               -Patient will be independent in HEP MET 8/25               -Improve TUG with walker support to 45 seconds or less. MET 8/25               -Will be able to complete sit<>supine with SBA demonstrating improvements in strength. MET 8/25        Long Term Goals: 8 Weeks  -LE strength and ROM WFL to allow for normal body mechanics with mobility. PROGRESSING   -LE strength WFL for symmetric LE WB during sit to stand without UE assist. PROGRESSING                -Resume community ambulation with use of a cane with normalized gait  mechanics. MET                -Sleep not disrupted by LE pain. Worst SPS rating less than 3/10. PROGRESSING 8/25  -LE strength / ROM WFL for patient to negotiate steps reciprocally for community mobility. PROGRESSING                -Report improved functional activities at home with PSFS from 1/10 to 7/10 or greater. MET 8/25     Plan: Continue to progress with improving bilateral knee flexion as ortho wants aggressive PT for improving motion. Will continue 2-3x/week for 6 weeks.       Total Session Time 40 and Timed code minutes 40  THERAPEUTIC EXERCISE 40 minutes    Start of Service:     Re-certification:           From:    Through:       I have reviewed this plan of treatment and re-certify a continuing need for service.    Referring Provider Signature:       Date:        Treatment Diagnosis:          William Hamburger, PT  05/02/2022, 11:54

## 2022-05-04 ENCOUNTER — Ambulatory Visit (HOSPITAL_COMMUNITY)
Admission: RE | Admit: 2022-05-04 | Discharge: 2022-05-04 | Disposition: A | Discharge status: Still In Hospital | Payer: MEDICAID | Source: Ambulatory Visit

## 2022-05-04 ENCOUNTER — Other Ambulatory Visit: Payer: Self-pay

## 2022-05-04 NOTE — PT Treatment (Signed)
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     Objective: Warm up on Nustep followed by exercises per flowsheet     EXERCISE/ACTIVITY NAME REPETITIONS RESISTANCE COMPLETED THIS DOS   Nustep     7 minutes Level 8  Y   Seated LAQ    15 each   2 sets  2# n   Seated knee flexion AROM/PROM  Supine Knee Flexion AROM/PROM    3X10 reps each   X5 reps each-5 second hold   Y   Supine SAQ    2X15  2# N   DKTC with swiss ball 20 each   n    Hip Adduction ball squeeze  2x10   n    Forward lunge on step   x5 holding 10 seconds   n    SLR   x20 reps bilaterally    N    Scar tissue massage     n    Stand knee flexion stretch on step 5 x 20 sec   Y   PROM flexion/extension seated     Y    Gait training  2 laps around clinic  With cane n    Step ups   x15 each leg  6 inch  Y   Prone knee bends with strap  5 x 20 sec   Y (no strap-manual overpressure)   Standing TKE X10 eacH RTB n   Leg press focus on knee flexion  2x10 30# n   Review of goals and assessment    n    Right AROM flexion in seated 97 degrees  Left AROM flexion in seated 106 degrees    Assessment:  Patient continues with limited ROM worse on right vs left. She is pain limited with amount of overpressure tolerated. She is unsure how much more therapy she will be able to participate with and states it may just be "these two weeks".        Short Term Goals: 4 Weeks               -Increase bilateral  knee AROM/PROM to 90 degrees or greater. Nearly MET on right, MET on left                -Strength of bilateral LE WFL for supine SLR without extension lag. PROGRESSING                -LE strength WFL for static stance with symmetric LE wb without pain. Progressing   -LE strength WFL for sit to stand with minimal increase in pain. MET 8/25               -Intermittent vs. constant pain. Worst SPS rating less than [6/10]. MET 8/25               -Patient will be  independent in HEP MET 8/25               -Improve TUG with walker support to 45 seconds or less. MET 8/25               -Will be able to complete sit<>supine with SBA demonstrating improvements in strength. MET 8/25        Long Term Goals: 8 Weeks  -LE strength and ROM WFL to allow for normal body mechanics with mobility. PROGRESSING   -LE strength WFL for symmetric LE WB during sit to stand without UE assist. PROGRESSING                -Resume community ambulation with use of a cane with normalized gait mechanics. MET                -Sleep not disrupted by LE pain. Worst SPS rating less than 3/10. PROGRESSING 8/25  -LE strength / ROM WFL for patient to negotiate steps reciprocally for community mobility. PROGRESSING                -Report improved functional activities at home with PSFS from 1/10 to 7/10 or greater. MET 8/25     Plan: Continue POC.      Total Session Time 40 and Timed code minutes 40  THERAPEUTIC EXERCISE 40 minutes

## 2022-05-05 ENCOUNTER — Ambulatory Visit (HOSPITAL_COMMUNITY)
Admission: RE | Admit: 2022-05-05 | Discharge: 2022-05-05 | Disposition: A | Discharge status: Still In Hospital | Payer: MEDICAID | Source: Ambulatory Visit

## 2022-05-05 ENCOUNTER — Other Ambulatory Visit: Payer: Self-pay

## 2022-05-05 NOTE — PT Treatment (Signed)
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      Objective: Warm up on Nustep followed by exercises per flowsheet     EXERCISE/ACTIVITY NAME REPETITIONS RESISTANCE COMPLETED THIS DOS   Nustep     8 minutes Level 8  Y   Seated LAQ    15 each   2 sets  2# n   Seated knee flexion AROM/PROM  Supine Knee Flexion AROM/PROM    3X10 reps each   X5 reps each-5 second hold   Y   Supine SAQ    2X15  2# N   DKTC with swiss ball 20 each   n    Hip Adduction ball squeeze  2x10   n    Forward lunge on step   x5 holding 10 seconds   n    SLR   x20 reps bilaterally    N    Scar tissue massage     n    Stand knee flexion stretch on step 5 x 20 sec   Y   PROM flexion/extension seated     Y    Gait training  2 laps around clinic  With cane n    Step ups   x15 each leg  6 inch  Y   Prone knee bends with strap  5 x 20 sec   Y (no strap-manual overpressure)   Standing TKE X10 eacH RTB n   Leg press focus on knee flexion  2x10 30# n   Review of goals and assessment    n     Left AROM flexion in supine 110 degrees    Assessment:  Patient able to achieve slightly more flexion on left in supine position at end of session today. She remains very limited with right knee flexion tolerating mild-moderate overpressure.       Short Term Goals: 4 Weeks               -Increase bilateral knee AROM/PROM to 90 degrees or greater. Nearly MET on right, MET on left                 -Strength of bilateral LE WFL for supine SLR without extension lag. PROGRESSING                -LE strength WFL for static stance with symmetric LE wb without pain. Progressing   -LE strength WFL for sit to stand with minimal increase in pain. MET 8/25               -Intermittent vs. constant pain. Worst SPS rating less than [6/10]. MET 8/25               -Patient will be independent in HEP MET 8/25               -Improve  TUG with walker support to 45 seconds or less. MET 8/25               -Will be able to complete sit<>supine with SBA demonstrating improvements in strength. MET 8/25        Long Term Goals: 8 Weeks  -LE strength and ROM WFL to allow for normal body mechanics with mobility. PROGRESSING   -LE strength WFL for symmetric LE WB during sit to stand without UE assist. PROGRESSING                -Resume community ambulation with use of a cane with normalized gait mechanics. MET                -Sleep not disrupted by LE pain. Worst SPS rating less than 3/10. PROGRESSING 8/25  -LE strength / ROM WFL for patient to negotiate steps reciprocally for community mobility. PROGRESSING                -Report improved functional activities at home with PSFS from 1/10 to 7/10 or greater. MET 8/25     Plan: Continue POC.      Total Session Time 40 and Timed code minutes 40  THERAPEUTIC EXERCISE 40 minutes

## 2022-05-08 ENCOUNTER — Ambulatory Visit (HOSPITAL_COMMUNITY): Payer: Self-pay

## 2022-05-09 ENCOUNTER — Ambulatory Visit (HOSPITAL_COMMUNITY): Payer: Self-pay

## 2022-05-09 ENCOUNTER — Ambulatory Visit
Admission: RE | Admit: 2022-05-09 | Discharge: 2022-05-09 | Disposition: A | Discharge status: Still In Hospital | Payer: MEDICAID | Source: Ambulatory Visit | Attending: Orthopaedic Surgery | Admitting: Orthopaedic Surgery

## 2022-05-09 ENCOUNTER — Other Ambulatory Visit: Payer: Self-pay

## 2022-05-09 NOTE — PT Treatment (Addendum)
[facility]  Outpatient Physical Therapy  [place], [address]  [phone]  [phone]    Physical Therapy Treatment Note    Date: 05/09/2022  Patient's Name: Monique Kline  Date of Birth: 07/22/1966      Addendum: This will serve as Discharge Summary. Patient returned on 05/11/22 with no further insurance authorization visits. She did have ortho appointment earlier this week and Naveah reports they were happy with results and states that she did not need to continue with therapy any longer. PT discussed with patient on 05/11/22 to ensure patient felt like she was where she wants to be in regards to function and strength. Taffy reports she feels like she is doing very well and knows exercises to continue to keep working on. Discussed with patient that if she feels like she is declining or having increased pain within the next month, let either ortho or primary doctor know and they write a new order for PT and we can reassess with new authorization as needed. In regards to progress she has made, patient has progressed from very significant limitations in ambulation with walker support to no walking with a cane for longer distances and without an assistive device within the home. She has also improved with functional range of motion and strength in bilateral knees, with right knee still more limited than left. Will discharge from PT services at this time with HEP.   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 Patient also feels as though she is ready to be (I) at this point.      Objective: Warm up  on Nustep followed by exercises per flowsheet.  R knee flexion: 100. L knee flexion 110.     EXERCISE/ACTIVITY NAME REPETITIONS RESISTANCE COMPLETED THIS DOS   Nustep     8 minutes Level 8  Y   Seated LAQ    15 each   2 sets  2# n   Seated knee flexion AROM/PROM  Supine Knee Flexion AROM/PROM    3X10 reps each   X5 reps each-5 second hold   Y   Supine SAQ    2X15  2# N   DKTC with swiss ball 20 each   y    Hip Adduction ball squeeze  2x10   n    Forward lunge on step   x5 holding 10 seconds   n    SLR   x20 reps bilaterally    N    Scar tissue massage     n    Stand knee flexion stretch on step 5 x 20 sec   Y   PROM flexion/extension seated     N- per patient   Gait training  2 laps around clinic  With cane n    Step ups   x15 each leg  6 inch  Y   Prone knee bends with strap  5 x 20 sec   n (no strap-manual overpressure)   Standing TKE X10 eacH RTB n   Leg press focus on knee flexion  2x10 30# n   Review of goals and assessment      n        Assessment:  Tol session well. Patient feels as though she has reached max potential at this point.        Short Term Goals: 4 Weeks               -Increase bilateral knee AROM/PROM to 90 degrees or greater. Nearly MET on right, MET on left                -Strength of bilateral LE WFL for supine SLR without extension lag. PROGRESSING                -LE strength WFL for static stance with symmetric LE wb without pain. Progressing   -LE strength WFL for sit to stand with minimal increase in pain. MET 8/25               -Intermittent vs. constant pain. Worst SPS rating less than [6/10]. MET 8/25               -Patient will be independent in HEP MET 8/25               -Improve TUG with walker support to 45 seconds or less. MET 8/25               -Will be able to complete sit<>supine with SBA demonstrating improvements in strength. MET 8/25        Long Term Goals: 8 Weeks  -LE strength and ROM WFL to allow for normal body mechanics with mobility. PROGRESSING   -LE strength WFL for  symmetric LE WB during sit to stand without UE assist. PROGRESSING                -Resume community ambulation with use of a cane with normalized gait mechanics. MET                -Sleep not disrupted by LE pain. Worst SPS rating less than 3/10. PROGRESSING 8/25  -LE strength / ROM WFL for patient to negotiate steps reciprocally for community mobility. PROGRESSING                -Report improved functional activities at home with PSFS from 1/10 to 7/10 or greater. MET 8/25     Plan: Plan for DC next session if PT feels is appropriate.       Total Session Time 40, Timed code minutes 40, and Untimed code minutes 0  THERAPEUTIC EXERCISE 40 minutes      Elinor Parkinson, PTA  05/09/2022, 11:52

## 2022-05-11 ENCOUNTER — Ambulatory Visit (HOSPITAL_COMMUNITY): Payer: Self-pay

## 2022-05-11 ENCOUNTER — Encounter (HOSPITAL_COMMUNITY): Payer: Self-pay

## 2022-09-05 ENCOUNTER — Other Ambulatory Visit: Payer: Self-pay

## 2022-09-05 ENCOUNTER — Encounter (HOSPITAL_BASED_OUTPATIENT_CLINIC_OR_DEPARTMENT_OTHER): Payer: Self-pay

## 2022-09-05 ENCOUNTER — Emergency Department
Admission: EM | Admit: 2022-09-05 | Discharge: 2022-09-05 | Disposition: A | Discharge status: Home / Self Care | Payer: Medicare PPO | Attending: Emergency Medicine | Admitting: Emergency Medicine

## 2022-09-05 DIAGNOSIS — X58XXXA Exposure to other specified factors, initial encounter: Secondary | ICD-10-CM | POA: Insufficient documentation

## 2022-09-05 DIAGNOSIS — T541X1A Toxic effect of other corrosive organic compounds, accidental (unintentional), initial encounter: Secondary | ICD-10-CM

## 2022-09-05 DIAGNOSIS — T2662XA Corrosion of cornea and conjunctival sac, left eye, initial encounter: Secondary | ICD-10-CM | POA: Insufficient documentation

## 2022-09-05 DIAGNOSIS — Y92512 Supermarket, store or market as the place of occurrence of the external cause: Secondary | ICD-10-CM

## 2022-09-05 DIAGNOSIS — T2692XA Corrosion of left eye and adnexa, part unspecified, initial encounter: Secondary | ICD-10-CM

## 2022-09-05 MED ORDER — CIPROFLOXACIN 0.3 % EYE DROPS
2.0000 [drp] | OPHTHALMIC | Status: DC
Start: 2022-09-05 — End: 2022-09-05
  Administered 2022-09-05: 2 [drp] via OPHTHALMIC

## 2022-09-05 MED ORDER — TETRACAINE HCL (PF) 0.5 % EYE DROPS
OPHTHALMIC | Status: AC
Start: 2022-09-05 — End: 2022-09-05
  Filled 2022-09-05: qty 80

## 2022-09-05 MED ORDER — CIPROFLOXACIN 0.3 % EYE DROPS
1.0000 [drp] | OPHTHALMIC | 0 refills | Status: AC
Start: 2022-09-05 — End: 2022-09-10

## 2022-09-05 MED ORDER — CIPROFLOXACIN 0.3 % EYE DROPS
OPHTHALMIC | Status: AC
Start: 2022-09-05 — End: 2022-09-05
  Filled 2022-09-05: qty 5

## 2022-09-05 MED ORDER — FLUORESCEIN 1 MG EYE STRIPS
1.0000 | ORAL_STRIP | OPHTHALMIC | Status: AC
Start: 2022-09-05 — End: 2022-09-05
  Administered 2022-09-05: 1 via OPHTHALMIC

## 2022-09-05 MED ORDER — CIPROFLOXACIN 0.3 % EYE DROPS
1.0000 [drp] | OPHTHALMIC | 0 refills | Status: DC
Start: 2022-09-05 — End: 2022-09-05

## 2022-09-05 MED ORDER — FLUORESCEIN 1 MG EYE STRIPS
ORAL_STRIP | OPHTHALMIC | Status: AC
Start: 2022-09-05 — End: 2022-09-05
  Filled 2022-09-05: qty 1

## 2022-09-05 MED ORDER — BALANCED SALT SOLUTION COMBINATION NO.2 INTRAOCULAR IRRIGATION
INTRAOCULAR | Status: AC
Start: 2022-09-05 — End: 2022-09-05
  Filled 2022-09-05: qty 15

## 2022-09-05 MED ORDER — TETRACAINE 0.5 % EYE DROPS
1.0000 [drp] | OPHTHALMIC | Status: AC
Start: 2022-09-05 — End: 2022-09-05
  Administered 2022-09-05: 1 [drp] via OPHTHALMIC

## 2022-09-05 NOTE — Discharge Instructions (Signed)
Use 2 drops every 2 hours for 6 hours, then every 4 hours for 5 days.

## 2022-09-05 NOTE — ED Provider Notes (Signed)
[facility], [facility] Emergency Department  ED Primary Provider Note  History of Present Illness   Chief Complaint   Patient presents with    Eye Problem     Arrival: The patient arrived by Car  Monique Kline is a 57 y.o. female who had concerns including Eye Problem. Pt stats reaching for lysol detergent at [employer] and it dripped into left eye. Flushed there. Pain continued. Denies contacts.     Review of Systems   Constitutional: No fever, chills or weakness   Skin: No rash or diaphoresis  HENT: No headaches, or congestion  Eyes: No vision changes or photophobia + burning , feels like something in left eye   Cardio: No chest pain, palpitations or leg swelling   Respiratory: No cough, wheezing or SOB  GI:  No nausea, vomiting or stool changes  GU:  No dysuria, hematuria, or increased frequency  MSK: No muscle aches, joint or back pain  Neuro: No seizures, LOC, numbness, tingling, or focal weakness  Psychiatric: No depression, SI or substance abuse  All other systems reviewed and are negative.    Historical Data   History Reviewed This Encounter:all noted and reviewed    Physical Exam   ED Triage Vitals [09/05/22 1500]   BP (Non-Invasive) 136/78   Heart Rate 86   Respiratory Rate 16   Temperature 36.4 C (97.6 F)   SpO2 100 %   Weight 97.5 kg (215 lb)   Height 1.575 m (5\' 2" )       Constitutional:  57 y.o. female who appears in no distress. Normal color, no cyanosis.   HENT:   Head: Normocephalic and atraumatic.   Mouth/Throat: Oropharynx is clear and moist.   Eyes: EOMI, PERRL black light corneal ulcer area over 5, 6,7,   Neck: Trachea midline. Neck supple.  Cardiovascular: RRR, No murmurs, rubs or gallops. Intact distal pulses.  Pulmonary/Chest: BS equal bilaterally. No respiratory distress. No wheezes, rales or chest tenderness.   Abdominal: Bowel sounds present and normal. Abdomen soft, no tenderness, no rebound and no guarding.  Back: No midline spinal tenderness, no  paraspinal tenderness, no CVA tenderness.           Musculoskeletal: No edema, tenderness or deformity.  Skin: warm and dry. No rash, erythema, pallor or cyanosis  Psychiatric: normal mood and affect. Behavior is normal.   Neurological: Patient keenly alert and responsive, easily able to raise eyebrows, facial muscles/expressions symmetric, speaking in fluent sentences, moving all extremities equally and fully, normal gait  Patient Data   Labs Ordered/Reviewed - No data to display  No orders to display     Medical Decision Making   Dif dx of corneal abrasion. Chemical abrasion.          Medications Administered in the ED   ciprofloxacin (CILOXIN) 0.3% ophthalmic solution (2 Drops Left Eye Given 09/05/22 1643)   tetracaine (PONTOCAINE) 0.5 % ophthalmic solution (1 Drop Both Eyes Given 09/05/22 1610)   fluorescein (FLUOR-I-STRIP) ophthalmic strip (1 Strip Left Eye Given 09/05/22 1600)     Clinical Impression   Corneal chemical burn, left, initial encounter (Primary)       Disposition: Discharged

## 2022-09-05 NOTE — ED Nurses Note (Signed)
Patient discharged home with family.  AVS reviewed with patient/care giver.  A written copy of the AVS and discharge instructions was given to the patient/care giver. Script escribed to preferred pharmacy. Questions sufficiently answered as needed.  Patient/care giver encouraged to follow up with PCP as indicated.  In the event of an emergency, patient/care giver instructed to call 911 or go to the nearest emergency room.

## 2022-09-05 NOTE — ED Triage Notes (Signed)
Patient reports laundry soap dripped down into left eye.

## 2023-01-17 ENCOUNTER — Other Ambulatory Visit: Payer: Self-pay

## 2023-01-17 ENCOUNTER — Ambulatory Visit (INDEPENDENT_AMBULATORY_CARE_PROVIDER_SITE_OTHER): Payer: Medicare PPO | Admitting: NEUROLOGY

## 2023-01-17 DIAGNOSIS — R200 Anesthesia of skin: Secondary | ICD-10-CM

## 2023-01-22 NOTE — Procedures (Signed)
NEUROLOGY, [facility]ICAL [facility]  [address] [address]    Procedure Note    Name: Murielle Stang MRN:  [id number]   Date: 01/17/2023 DOB:  06/13/1966 (57 y.o.)         [address] - NEEDLE ELECTROMYOGRAPHY; 1 EXTREMITY W/ OR W/O RELATED PARASPINAL AREAS (AMB ONLY)    Performed by: Dyke Brackett, DO  Authorized by: Dyke Brackett, DO        ELECTROMYOGRAPHY REPORT    DATE OF SERVICE:  01/22/2023    PERFORMING PHYSICIAN:  Birdie Hopes, D.O.      REFERRING PROVIDER:  Yaakov Guthrie, FNP    HISTORY:  She is a 57 year old woman who is referred for electrodiagnostic testing for abnormal sensory testing in the lower extremities. Patient has not noticed any symptoms herself.     EXAMINATION:  Strength was full throughout. Deep tendon reflexes were reduced at the ankles and otherwise 2+ throughout. Pin sensation was mildly reduced at the toes.    NERVE CONDUCTION STUDIES:  1.Normal right sural sensory nerve action potentials (SNAPs).  2.Normal fibular-EDB and tibial-AH compound muscle action potentials (CMAPs).    ELECTROMYOGRAPHY:  Normal studies for the right medial gastrocnemius, tibialis anterior, vastus lateralis and tensor fascia lata.     IMPRESSION:  This is a normal study.  Specifically, there is no electrophysiologic evidence for a large fiber polyneuropathy at this time.     This study is not designed to evaluate for small fiber sensory neuropathy.    The patient was instructed to follow up with the referring provider for further evaluation and management of the noted findings.      Patrick North, DO  Assistant Professor of Neurology  [facility]
